# Patient Record
Sex: Female | Born: 1945 | Race: White | Hispanic: No | Smoking: Former smoker
Health system: Southern US, Community
[De-identification: ages and names within clinical notes are randomized; demographics above are authoritative.]

## PROBLEM LIST (undated history)

## (undated) DIAGNOSIS — D649 Anemia, unspecified: Secondary | ICD-10-CM

## (undated) DIAGNOSIS — I712 Thoracic aortic aneurysm, without rupture: Secondary | ICD-10-CM

## (undated) DIAGNOSIS — G4700 Insomnia, unspecified: Secondary | ICD-10-CM

## (undated) DIAGNOSIS — N63 Unspecified lump in unspecified breast: Secondary | ICD-10-CM

## (undated) DIAGNOSIS — E785 Hyperlipidemia, unspecified: Secondary | ICD-10-CM

## (undated) DIAGNOSIS — E7800 Pure hypercholesterolemia, unspecified: Secondary | ICD-10-CM

## (undated) DIAGNOSIS — M1990 Unspecified osteoarthritis, unspecified site: Secondary | ICD-10-CM

## (undated) DIAGNOSIS — K469 Unspecified abdominal hernia without obstruction or gangrene: Secondary | ICD-10-CM

## (undated) DIAGNOSIS — I10 Essential (primary) hypertension: Secondary | ICD-10-CM

## (undated) HISTORY — DX: Insomnia, unspecified: G47.00

## (undated) HISTORY — DX: Unspecified osteoarthritis, unspecified site: M19.90

## (undated) HISTORY — DX: Thoracic aortic aneurysm, without rupture: I71.2

## (undated) HISTORY — PX: OOPHORECTOMY: SHX86

## (undated) HISTORY — DX: Pure hypercholesterolemia, unspecified: E78.00

## (undated) HISTORY — DX: Anemia, unspecified: D64.9

## (undated) HISTORY — PX: BLADDER SURGERY: SHX569

## (undated) HISTORY — PX: COLON SURGERY: SHX602

## (undated) HISTORY — DX: Unspecified abdominal hernia without obstruction or gangrene: K46.9

## (undated) HISTORY — DX: Unspecified lump in unspecified breast: N63.0

## (undated) HISTORY — PX: VESICOVAGINAL FISTULA CLOSURE W/ TAH: SUR271

## (undated) HISTORY — DX: Hyperlipidemia, unspecified: E78.5

## (undated) HISTORY — PX: OTHER SURGICAL HISTORY: SHX169

## (undated) HISTORY — DX: Essential (primary) hypertension: I10

---

## 1988-07-14 HISTORY — PX: ABDOMINAL HYSTERECTOMY: SHX81

## 1988-07-14 HISTORY — PX: FOOT SURGERY: SHX648

## 1997-12-27 ENCOUNTER — Ambulatory Visit (HOSPITAL_COMMUNITY): Admission: RE | Admit: 1997-12-27 | Discharge: 1997-12-28 | Payer: Self-pay | Admitting: Urology

## 1998-06-29 ENCOUNTER — Other Ambulatory Visit: Admission: RE | Admit: 1998-06-29 | Discharge: 1998-06-29 | Payer: Self-pay | Admitting: *Deleted

## 1998-07-30 ENCOUNTER — Emergency Department (HOSPITAL_COMMUNITY): Admission: EM | Admit: 1998-07-30 | Discharge: 1998-07-30 | Payer: Self-pay | Admitting: Emergency Medicine

## 1998-08-06 ENCOUNTER — Encounter: Payer: Self-pay | Admitting: Family Medicine

## 1998-08-06 ENCOUNTER — Ambulatory Visit (HOSPITAL_COMMUNITY): Admission: RE | Admit: 1998-08-06 | Discharge: 1998-08-06 | Payer: Self-pay | Admitting: Family Medicine

## 1999-06-28 ENCOUNTER — Other Ambulatory Visit: Admission: RE | Admit: 1999-06-28 | Discharge: 1999-06-28 | Payer: Self-pay | Admitting: *Deleted

## 2000-07-22 ENCOUNTER — Other Ambulatory Visit: Admission: RE | Admit: 2000-07-22 | Discharge: 2000-07-22 | Payer: Self-pay | Admitting: *Deleted

## 2002-07-22 ENCOUNTER — Other Ambulatory Visit: Admission: RE | Admit: 2002-07-22 | Discharge: 2002-07-22 | Payer: Self-pay | Admitting: Internal Medicine

## 2003-01-12 HISTORY — PX: OTHER SURGICAL HISTORY: SHX169

## 2004-09-09 ENCOUNTER — Ambulatory Visit: Payer: Self-pay | Admitting: Internal Medicine

## 2004-09-16 ENCOUNTER — Ambulatory Visit: Payer: Self-pay | Admitting: Internal Medicine

## 2004-09-24 ENCOUNTER — Ambulatory Visit: Payer: Self-pay | Admitting: Family Medicine

## 2004-10-18 ENCOUNTER — Ambulatory Visit: Payer: Self-pay | Admitting: Internal Medicine

## 2005-02-11 ENCOUNTER — Inpatient Hospital Stay: Payer: Self-pay | Admitting: Specialist

## 2005-02-24 ENCOUNTER — Ambulatory Visit: Payer: Self-pay | Admitting: Internal Medicine

## 2005-07-14 DIAGNOSIS — I10 Essential (primary) hypertension: Secondary | ICD-10-CM

## 2005-07-14 HISTORY — DX: Essential (primary) hypertension: I10

## 2005-09-15 ENCOUNTER — Ambulatory Visit: Payer: Self-pay | Admitting: Internal Medicine

## 2005-09-22 ENCOUNTER — Ambulatory Visit: Payer: Self-pay | Admitting: Internal Medicine

## 2005-11-17 ENCOUNTER — Ambulatory Visit: Payer: Self-pay | Admitting: Internal Medicine

## 2006-04-06 ENCOUNTER — Ambulatory Visit: Payer: Self-pay | Admitting: Internal Medicine

## 2006-06-08 ENCOUNTER — Ambulatory Visit: Payer: Self-pay | Admitting: Internal Medicine

## 2006-09-12 ENCOUNTER — Ambulatory Visit: Payer: Self-pay | Admitting: Internal Medicine

## 2006-09-18 ENCOUNTER — Ambulatory Visit: Payer: Self-pay | Admitting: Internal Medicine

## 2006-09-18 LAB — CONVERTED CEMR LAB
ALT: 22 units/L (ref 0–40)
AST: 20 units/L (ref 0–37)
Albumin: 3.6 g/dL (ref 3.5–5.2)
Alkaline Phosphatase: 84 units/L (ref 39–117)
BUN: 11 mg/dL (ref 6–23)
Bacteria, UA: NEGATIVE
Basophils Absolute: 0 10*3/uL (ref 0.0–0.1)
Basophils Relative: 0.7 % (ref 0.0–1.0)
Bilirubin Urine: NEGATIVE
Bilirubin, Direct: 0.2 mg/dL (ref 0.0–0.3)
CO2: 28 meq/L (ref 19–32)
Calcium: 9.3 mg/dL (ref 8.4–10.5)
Chloride: 107 meq/L (ref 96–112)
Cholesterol: 121 mg/dL (ref 0–200)
Creatinine, Ser: 0.6 mg/dL (ref 0.4–1.2)
Crystals: NEGATIVE
Eosinophils Absolute: 0.2 10*3/uL (ref 0.0–0.6)
Eosinophils Relative: 3.3 % (ref 0.0–5.0)
GFR calc Af Amer: 130 mL/min
GFR calc non Af Amer: 107 mL/min
Glucose, Bld: 93 mg/dL (ref 70–99)
HCT: 41.3 % (ref 36.0–46.0)
HDL: 40.2 mg/dL (ref >39.0)
Hemoglobin, Urine: NEGATIVE
Hemoglobin: 14.1 g/dL (ref 12.0–15.0)
Ketones, ur: NEGATIVE mg/dL
LDL Cholesterol: 54 mg/dL (ref 0–99)
Lymphocytes Relative: 23.1 % (ref 12.0–46.0)
MCHC: 34.1 g/dL (ref 30.0–36.0)
MCV: 92.5 fL (ref 78.0–100.0)
Monocytes Absolute: 0.4 10*3/uL (ref 0.2–0.7)
Monocytes Relative: 7 % (ref 3.0–11.0)
Neutro Abs: 4.2 10*3/uL (ref 1.4–7.7)
Neutrophils Relative %: 65.9 % (ref 43.0–77.0)
Nitrite: NEGATIVE
Platelets: 295 10*3/uL (ref 150–400)
Potassium: 4.3 meq/L (ref 3.5–5.1)
RBC: 4.47 M/uL (ref 3.87–5.11)
RDW: 11.8 % (ref 11.5–14.6)
Sodium: 142 meq/L (ref 135–145)
Specific Gravity, Urine: 1.01 (ref 1.000–1.03)
TSH: 1.8 microintl units/mL (ref 0.35–5.50)
Total Bilirubin: 1 mg/dL (ref 0.3–1.2)
Total CHOL/HDL Ratio: 3
Total Protein, Urine: NEGATIVE mg/dL
Total Protein: 6.4 g/dL (ref 6.0–8.3)
Triglycerides: 132 mg/dL (ref 0–149)
Urine Glucose: NEGATIVE mg/dL
Urobilinogen, UA: 0.2 (ref 0.0–1.0)
VLDL: 26 mg/dL (ref 0–40)
WBC: 6.3 10*3/uL (ref 4.5–10.5)
pH: 6.5 (ref 5.0–8.0)

## 2006-09-24 ENCOUNTER — Ambulatory Visit: Payer: Self-pay | Admitting: Internal Medicine

## 2006-11-16 ENCOUNTER — Ambulatory Visit: Payer: Self-pay | Admitting: Internal Medicine

## 2006-12-12 ENCOUNTER — Ambulatory Visit: Payer: Self-pay | Admitting: Family Medicine

## 2007-01-13 ENCOUNTER — Ambulatory Visit: Payer: Self-pay | Admitting: Internal Medicine

## 2007-01-14 ENCOUNTER — Encounter: Payer: Self-pay | Admitting: Internal Medicine

## 2007-02-05 DIAGNOSIS — E785 Hyperlipidemia, unspecified: Secondary | ICD-10-CM

## 2007-02-25 ENCOUNTER — Encounter: Payer: Self-pay | Admitting: Internal Medicine

## 2007-03-10 DIAGNOSIS — M1990 Unspecified osteoarthritis, unspecified site: Secondary | ICD-10-CM | POA: Insufficient documentation

## 2007-03-10 DIAGNOSIS — G4700 Insomnia, unspecified: Secondary | ICD-10-CM

## 2007-03-10 DIAGNOSIS — J309 Allergic rhinitis, unspecified: Secondary | ICD-10-CM | POA: Insufficient documentation

## 2007-03-10 DIAGNOSIS — I10 Essential (primary) hypertension: Secondary | ICD-10-CM | POA: Insufficient documentation

## 2007-06-11 ENCOUNTER — Ambulatory Visit: Payer: Self-pay | Admitting: Internal Medicine

## 2007-06-11 DIAGNOSIS — J019 Acute sinusitis, unspecified: Secondary | ICD-10-CM

## 2007-06-11 DIAGNOSIS — N390 Urinary tract infection, site not specified: Secondary | ICD-10-CM

## 2007-06-11 DIAGNOSIS — D649 Anemia, unspecified: Secondary | ICD-10-CM

## 2007-06-11 LAB — CONVERTED CEMR LAB
Bilirubin Urine: NEGATIVE
Blood in Urine, dipstick: NEGATIVE
Glucose, Urine, Semiquant: NEGATIVE
Ketones, urine, test strip: NEGATIVE
Nitrite: POSITIVE
Protein, U semiquant: NEGATIVE
Specific Gravity, Urine: 1.015
Urobilinogen, UA: NEGATIVE
pH: 6.5

## 2007-09-20 ENCOUNTER — Ambulatory Visit: Payer: Self-pay | Admitting: Internal Medicine

## 2007-09-27 ENCOUNTER — Ambulatory Visit: Payer: Self-pay | Admitting: Internal Medicine

## 2007-09-27 DIAGNOSIS — I8390 Asymptomatic varicose veins of unspecified lower extremity: Secondary | ICD-10-CM

## 2007-10-06 ENCOUNTER — Encounter: Payer: Self-pay | Admitting: Internal Medicine

## 2007-10-06 ENCOUNTER — Ambulatory Visit: Payer: Self-pay | Admitting: Vascular Surgery

## 2007-10-25 ENCOUNTER — Ambulatory Visit: Payer: Self-pay | Admitting: Internal Medicine

## 2007-10-25 LAB — CONVERTED CEMR LAB
ALT: 19 units/L (ref 0–35)
AST: 20 units/L (ref 0–37)
Albumin: 3.6 g/dL (ref 3.5–5.2)
Alkaline Phosphatase: 74 units/L (ref 39–117)
Bilirubin, Direct: 0.1 mg/dL (ref 0.0–0.3)
Cholesterol: 124 mg/dL (ref 0–200)
HDL: 33.9 mg/dL — ABNORMAL LOW (ref >39.0)
LDL Cholesterol: 53 mg/dL (ref 0–99)
Total Bilirubin: 0.8 mg/dL (ref 0.3–1.2)
Total CHOL/HDL Ratio: 3.7
Total Protein: 6.5 g/dL (ref 6.0–8.3)
Triglycerides: 185 mg/dL — ABNORMAL HIGH (ref 0–149)
VLDL: 37 mg/dL (ref 0–40)

## 2008-01-21 ENCOUNTER — Ambulatory Visit: Payer: Self-pay | Admitting: Vascular Surgery

## 2008-02-05 ENCOUNTER — Ambulatory Visit: Payer: Self-pay | Admitting: Family Medicine

## 2008-02-05 LAB — CONVERTED CEMR LAB
Blood in Urine, dipstick: NEGATIVE
Glucose, Urine, Semiquant: NEGATIVE
Ketones, urine, test strip: NEGATIVE
Nitrite: NEGATIVE
Protein, U semiquant: NEGATIVE
Specific Gravity, Urine: 1.015
Urobilinogen, UA: 0.2
WBC Urine, dipstick: NEGATIVE
pH: 6.5

## 2008-02-23 ENCOUNTER — Ambulatory Visit: Payer: Self-pay | Admitting: Vascular Surgery

## 2008-03-01 ENCOUNTER — Ambulatory Visit: Payer: Self-pay | Admitting: Vascular Surgery

## 2008-03-22 ENCOUNTER — Ambulatory Visit: Payer: Self-pay | Admitting: Vascular Surgery

## 2008-03-29 ENCOUNTER — Ambulatory Visit: Payer: Self-pay | Admitting: Vascular Surgery

## 2008-04-12 ENCOUNTER — Ambulatory Visit: Payer: Self-pay | Admitting: Internal Medicine

## 2008-04-12 LAB — CONVERTED CEMR LAB
Basophils Absolute: 0 10*3/uL (ref 0.0–0.1)
Basophils Relative: 0.4 % (ref 0.0–3.0)
Bilirubin Urine: NEGATIVE
Blood in Urine, dipstick: NEGATIVE
Eosinophils Absolute: 0.1 10*3/uL (ref 0.0–0.7)
Eosinophils Relative: 1.6 % (ref 0.0–5.0)
Folate: 18.5 ng/mL
Glucose, Urine, Semiquant: NEGATIVE
HCT: 39.5 % (ref 36.0–46.0)
Hemoglobin: 13.3 g/dL (ref 12.0–15.0)
Iron: 48 ug/dL (ref 42–145)
Lymphocytes Relative: 21.8 % (ref 12.0–46.0)
MCHC: 33.8 g/dL (ref 30.0–36.0)
MCV: 91.6 fL (ref 78.0–100.0)
Monocytes Absolute: 0.7 10*3/uL (ref 0.1–1.0)
Monocytes Relative: 8.1 % (ref 3.0–12.0)
Neutro Abs: 5.8 10*3/uL (ref 1.4–7.7)
Neutrophils Relative %: 68.1 % (ref 43.0–77.0)
Nitrite: POSITIVE
Platelets: 351 10*3/uL (ref 150–400)
RBC: 4.31 M/uL (ref 3.87–5.11)
RDW: 12.9 % (ref 11.5–14.6)
Saturation Ratios: 13.7 % — ABNORMAL LOW (ref 20.0–50.0)
Specific Gravity, Urine: 1.02
Transferrin: 250.1 mg/dL (ref >=212.0)
Urobilinogen, UA: 0.2
Vitamin B-12: 386 pg/mL (ref 211–911)
WBC: 8.4 10*3/uL (ref 4.5–10.5)
pH: 5

## 2008-04-13 ENCOUNTER — Encounter: Payer: Self-pay | Admitting: Internal Medicine

## 2008-04-26 ENCOUNTER — Ambulatory Visit: Payer: Self-pay | Admitting: Vascular Surgery

## 2008-04-29 ENCOUNTER — Ambulatory Visit: Payer: Self-pay | Admitting: Internal Medicine

## 2008-07-14 DIAGNOSIS — K469 Unspecified abdominal hernia without obstruction or gangrene: Secondary | ICD-10-CM

## 2008-07-14 HISTORY — DX: Unspecified abdominal hernia without obstruction or gangrene: K46.9

## 2008-07-14 HISTORY — PX: OTHER SURGICAL HISTORY: SHX169

## 2008-08-09 ENCOUNTER — Ambulatory Visit: Payer: Self-pay | Admitting: Internal Medicine

## 2008-08-22 ENCOUNTER — Ambulatory Visit: Payer: Self-pay | Admitting: Internal Medicine

## 2008-08-22 DIAGNOSIS — N318 Other neuromuscular dysfunction of bladder: Secondary | ICD-10-CM

## 2008-08-22 DIAGNOSIS — H6690 Otitis media, unspecified, unspecified ear: Secondary | ICD-10-CM | POA: Insufficient documentation

## 2008-08-22 DIAGNOSIS — R300 Dysuria: Secondary | ICD-10-CM | POA: Insufficient documentation

## 2008-08-22 LAB — CONVERTED CEMR LAB
Bilirubin Urine: NEGATIVE
Crystals: NEGATIVE
Hemoglobin, Urine: NEGATIVE
Ketones, ur: NEGATIVE mg/dL
Mucus, UA: NEGATIVE
Nitrite: NEGATIVE
Specific Gravity, Urine: <=1.005 (ref 1.000–1.03)
Total Protein, Urine: NEGATIVE mg/dL
Urine Glucose: NEGATIVE mg/dL
Urobilinogen, UA: 0.2 (ref 0.0–1.0)
pH: 6 (ref 5.0–8.0)

## 2008-08-23 ENCOUNTER — Encounter: Payer: Self-pay | Admitting: Internal Medicine

## 2008-09-11 ENCOUNTER — Ambulatory Visit: Payer: Self-pay | Admitting: Internal Medicine

## 2008-09-11 DIAGNOSIS — M25569 Pain in unspecified knee: Secondary | ICD-10-CM

## 2008-09-22 ENCOUNTER — Encounter: Payer: Self-pay | Admitting: Internal Medicine

## 2008-09-28 ENCOUNTER — Ambulatory Visit: Payer: Self-pay | Admitting: Internal Medicine

## 2008-12-06 ENCOUNTER — Encounter: Payer: Self-pay | Admitting: Internal Medicine

## 2008-12-06 ENCOUNTER — Ambulatory Visit: Payer: Self-pay | Admitting: Internal Medicine

## 2009-01-26 ENCOUNTER — Inpatient Hospital Stay: Payer: Self-pay | Admitting: Vascular Surgery

## 2009-02-05 ENCOUNTER — Inpatient Hospital Stay: Payer: Self-pay | Admitting: Vascular Surgery

## 2009-02-21 ENCOUNTER — Inpatient Hospital Stay: Payer: Self-pay | Admitting: Vascular Surgery

## 2009-02-26 ENCOUNTER — Ambulatory Visit: Payer: Self-pay | Admitting: Vascular Surgery

## 2009-03-01 ENCOUNTER — Ambulatory Visit: Payer: Self-pay | Admitting: Vascular Surgery

## 2009-03-05 ENCOUNTER — Ambulatory Visit: Payer: Self-pay | Admitting: Vascular Surgery

## 2009-03-07 ENCOUNTER — Ambulatory Visit: Payer: Self-pay | Admitting: Vascular Surgery

## 2009-03-12 ENCOUNTER — Ambulatory Visit: Payer: Self-pay | Admitting: Vascular Surgery

## 2009-03-16 ENCOUNTER — Ambulatory Visit: Payer: Self-pay | Admitting: Vascular Surgery

## 2009-03-20 ENCOUNTER — Inpatient Hospital Stay: Payer: Self-pay | Admitting: Vascular Surgery

## 2009-03-26 ENCOUNTER — Ambulatory Visit: Payer: Self-pay | Admitting: Vascular Surgery

## 2009-03-28 ENCOUNTER — Ambulatory Visit: Payer: Self-pay | Admitting: Vascular Surgery

## 2009-05-10 ENCOUNTER — Ambulatory Visit: Payer: Self-pay | Admitting: Vascular Surgery

## 2009-05-11 ENCOUNTER — Ambulatory Visit: Payer: Self-pay | Admitting: Vascular Surgery

## 2009-07-14 DIAGNOSIS — I712 Thoracic aortic aneurysm, without rupture, unspecified: Secondary | ICD-10-CM

## 2009-07-14 HISTORY — DX: Thoracic aortic aneurysm, without rupture: I71.2

## 2009-07-14 HISTORY — DX: Thoracic aortic aneurysm, without rupture, unspecified: I71.20

## 2009-09-27 ENCOUNTER — Ambulatory Visit: Payer: Self-pay | Admitting: Internal Medicine

## 2010-03-19 ENCOUNTER — Ambulatory Visit: Payer: Self-pay | Admitting: Otolaryngology

## 2010-03-25 ENCOUNTER — Ambulatory Visit: Payer: Self-pay | Admitting: Internal Medicine

## 2010-05-14 ENCOUNTER — Encounter: Payer: Self-pay | Admitting: Cardiovascular Disease

## 2010-06-13 ENCOUNTER — Encounter: Payer: Self-pay | Admitting: Cardiovascular Disease

## 2010-06-20 ENCOUNTER — Encounter: Payer: Self-pay | Admitting: Cardiovascular Disease

## 2010-07-14 DIAGNOSIS — M1990 Unspecified osteoarthritis, unspecified site: Secondary | ICD-10-CM

## 2010-07-14 HISTORY — DX: Unspecified osteoarthritis, unspecified site: M19.90

## 2010-07-14 HISTORY — PX: CHOLECYSTECTOMY: SHX55

## 2010-07-16 ENCOUNTER — Encounter: Payer: Self-pay | Admitting: Cardiovascular Disease

## 2010-07-16 ENCOUNTER — Ambulatory Visit
Admission: RE | Admit: 2010-07-16 | Discharge: 2010-07-16 | Payer: Self-pay | Source: Home / Self Care | Attending: Cardiovascular Disease | Admitting: Cardiovascular Disease

## 2010-07-16 DIAGNOSIS — F17200 Nicotine dependence, unspecified, uncomplicated: Secondary | ICD-10-CM | POA: Insufficient documentation

## 2010-07-16 DIAGNOSIS — R011 Cardiac murmur, unspecified: Secondary | ICD-10-CM | POA: Insufficient documentation

## 2010-07-16 DIAGNOSIS — R0602 Shortness of breath: Secondary | ICD-10-CM | POA: Insufficient documentation

## 2010-07-30 ENCOUNTER — Ambulatory Visit: Admission: RE | Admit: 2010-07-30 | Discharge: 2010-07-30 | Payer: Self-pay | Source: Home / Self Care

## 2010-07-30 ENCOUNTER — Encounter: Payer: Self-pay | Admitting: Cardiovascular Disease

## 2010-08-01 ENCOUNTER — Encounter: Payer: Self-pay | Admitting: Cardiovascular Disease

## 2010-08-01 ENCOUNTER — Ambulatory Visit: Admit: 2010-08-01 | Payer: Self-pay | Admitting: Cardiovascular Disease

## 2010-08-01 DIAGNOSIS — I712 Thoracic aortic aneurysm, without rupture, unspecified: Secondary | ICD-10-CM | POA: Insufficient documentation

## 2010-08-02 ENCOUNTER — Encounter: Payer: Self-pay | Admitting: Cardiovascular Disease

## 2010-08-02 ENCOUNTER — Ambulatory Visit
Admission: RE | Admit: 2010-08-02 | Discharge: 2010-08-02 | Payer: Self-pay | Source: Home / Self Care | Attending: Cardiovascular Disease | Admitting: Cardiovascular Disease

## 2010-08-05 LAB — CONVERTED CEMR LAB
BUN: 15 mg/dL (ref 6–23)
CO2: 21 meq/L (ref 19–32)
Glucose, Bld: 87 mg/dL (ref 70–99)
Potassium: 4.5 meq/L (ref 3.5–5.3)
Sodium: 138 meq/L (ref 135–145)

## 2010-08-06 ENCOUNTER — Encounter: Payer: Self-pay | Admitting: Cardiovascular Disease

## 2010-08-06 ENCOUNTER — Ambulatory Visit (HOSPITAL_COMMUNITY)
Admission: RE | Admit: 2010-08-06 | Discharge: 2010-08-06 | Payer: Self-pay | Source: Home / Self Care | Attending: Cardiovascular Disease | Admitting: Cardiovascular Disease

## 2010-08-11 LAB — CONVERTED CEMR LAB
ALT: 25 units/L (ref 0–35)
ALT: 33 units/L (ref 0–35)
AST: 21 units/L (ref 0–37)
AST: 24 units/L (ref 0–37)
Albumin: 3.7 g/dL (ref 3.5–5.2)
Albumin: 3.9 g/dL (ref 3.5–5.2)
Alkaline Phosphatase: 76 units/L (ref 39–117)
Alkaline Phosphatase: 91 units/L (ref 39–117)
BUN: 10 mg/dL (ref 6–23)
BUN: 13 mg/dL (ref 6–23)
Bacteria, UA: NEGATIVE
Basophils Absolute: 0.1 10*3/uL (ref 0.0–0.1)
Basophils Absolute: 0.1 10*3/uL (ref 0.0–0.1)
Basophils Relative: 0.7 % (ref 0.0–3.0)
Basophils Relative: 0.8 % (ref 0.0–1.0)
Bilirubin Urine: NEGATIVE
Bilirubin Urine: NEGATIVE
Bilirubin, Direct: 0.2 mg/dL (ref 0.0–0.3)
Bilirubin, Direct: 0.2 mg/dL (ref 0.0–0.3)
CO2: 26 meq/L (ref 19–32)
CO2: 27 meq/L (ref 19–32)
Calcium: 9.4 mg/dL (ref 8.4–10.5)
Calcium: 9.7 mg/dL (ref 8.4–10.5)
Chloride: 107 meq/L (ref 96–112)
Chloride: 110 meq/L (ref 96–112)
Cholesterol: 131 mg/dL (ref 0–200)
Cholesterol: 134 mg/dL (ref 0–200)
Creatinine, Ser: 0.6 mg/dL (ref 0.4–1.2)
Creatinine, Ser: 0.6 mg/dL (ref 0.4–1.2)
Crystals: NEGATIVE
Direct LDL: 56.8 mg/dL
Eosinophils Absolute: 0.1 10*3/uL (ref 0.0–0.7)
Eosinophils Absolute: 0.2 10*3/uL (ref 0.0–0.6)
Eosinophils Relative: 1.6 % (ref 0.0–5.0)
Eosinophils Relative: 2.1 % (ref 0.0–5.0)
GFR calc Af Amer: 129 mL/min
GFR calc non Af Amer: 106.58 mL/min (ref >60)
GFR calc non Af Amer: 107 mL/min
Glucose, Bld: 90 mg/dL (ref 70–99)
Glucose, Bld: 91 mg/dL (ref 70–99)
HCT: 40.1 % (ref 36.0–46.0)
HCT: 44.5 % (ref 36.0–46.0)
HDL: 32.9 mg/dL — ABNORMAL LOW (ref >39.0)
HDL: 35.2 mg/dL — ABNORMAL LOW (ref >39.00)
Hemoglobin, Urine: NEGATIVE
Hemoglobin, Urine: NEGATIVE
Hemoglobin: 13.2 g/dL (ref 12.0–15.0)
Hemoglobin: 15.1 g/dL — ABNORMAL HIGH (ref 12.0–15.0)
Ketones, ur: NEGATIVE mg/dL
Ketones, ur: NEGATIVE mg/dL
LDL Cholesterol: 59 mg/dL (ref 0–99)
Lymphocytes Relative: 19.9 % (ref 12.0–46.0)
Lymphocytes Relative: 22.5 % (ref 12.0–46.0)
Lymphs Abs: 1.7 10*3/uL (ref 0.7–4.0)
MCHC: 33 g/dL (ref 30.0–36.0)
MCHC: 33.9 g/dL (ref 30.0–36.0)
MCV: 90.2 fL (ref 78.0–100.0)
MCV: 91.6 fL (ref 78.0–100.0)
Monocytes Absolute: 0.5 10*3/uL (ref 0.1–1.0)
Monocytes Absolute: 0.7 10*3/uL (ref 0.2–0.7)
Monocytes Relative: 7.1 % (ref 3.0–12.0)
Monocytes Relative: 8.3 % (ref 3.0–11.0)
Mucus, UA: NEGATIVE
Neutro Abs: 5.2 10*3/uL (ref 1.4–7.7)
Neutro Abs: 5.4 10*3/uL (ref 1.4–7.7)
Neutrophils Relative %: 68.1 % (ref 43.0–77.0)
Neutrophils Relative %: 68.9 % (ref 43.0–77.0)
Nitrite: NEGATIVE
Nitrite: NEGATIVE
Platelets: 288 10*3/uL (ref 150.0–400.0)
Platelets: 318 10*3/uL (ref 150–400)
Potassium: 4.3 meq/L (ref 3.5–5.1)
Potassium: 4.7 meq/L (ref 3.5–5.1)
RBC: 4.44 M/uL (ref 3.87–5.11)
RBC: 4.85 M/uL (ref 3.87–5.11)
RDW: 12.9 % (ref 11.5–14.6)
RDW: 13.8 % (ref 11.5–14.6)
Sodium: 139 meq/L (ref 135–145)
Sodium: 143 meq/L (ref 135–145)
Specific Gravity, Urine: 1.01 (ref 1.000–1.03)
Specific Gravity, Urine: <=1.005 (ref 1.000–1.030)
TSH: 1.95 microintl units/mL (ref 0.35–5.50)
TSH: 2.65 microintl units/mL (ref 0.35–5.50)
Total Bilirubin: 0.7 mg/dL (ref 0.3–1.2)
Total Bilirubin: 1 mg/dL (ref 0.3–1.2)
Total CHOL/HDL Ratio: 4
Total CHOL/HDL Ratio: 4
Total Protein, Urine: NEGATIVE mg/dL
Total Protein, Urine: NEGATIVE mg/dL
Total Protein: 6.1 g/dL (ref 6.0–8.3)
Total Protein: 6.4 g/dL (ref 6.0–8.3)
Triglycerides: 198 mg/dL — ABNORMAL HIGH (ref 0–149)
Triglycerides: 300 mg/dL — ABNORMAL HIGH (ref 0.0–149.0)
Urine Glucose: NEGATIVE mg/dL
Urine Glucose: NEGATIVE mg/dL
Urobilinogen, UA: 0.2 (ref 0.0–1.0)
Urobilinogen, UA: 0.2 (ref 0.0–1.0)
VLDL: 40 mg/dL (ref 0–40)
VLDL: 60 mg/dL — ABNORMAL HIGH (ref 0.0–40.0)
Vit D, 25-Hydroxy: 33 ng/mL (ref 30–89)
WBC: 7.6 10*3/uL (ref 4.5–10.5)
WBC: 8 10*3/uL (ref 4.5–10.5)
pH: 5.5 (ref 5.0–8.0)
pH: 6 (ref 5.0–8.0)

## 2010-08-15 NOTE — Miscellaneous (Signed)
Summary: CTA Chest orders  Clinical Lists Changes  Orders: Added new Test order of T-CT Chest Angiography ([id number]) - Signed

## 2010-08-15 NOTE — Miscellaneous (Signed)
Summary: CTA Chest orders  Clinical Lists Changes  Problems: Added new problem of ASCENDING AORTIC ANEURYSM (ICD-441.2) Orders: Added new Referral order of Cardiac CTA (Cardiac CTA) - Signed

## 2010-08-15 NOTE — Letter (Signed)
Summary: [facility]   Imported By: Marylou Mccoy 07/25/2010 10:25:40  _____________________________________________________________________  External Attachment:    Type:   Image     Comment:   External Document

## 2010-08-15 NOTE — Assessment & Plan Note (Signed)
Summary: NP6/AMD   Visit Type:  Initial Consult Primary Provider:  Carmine Savoy.  CC:  c/o chest congestion with shortness of breath; she does have a sinus infection.Marland Kitchen  History of Present Illness: [name] is a very pleasant 65 year old woman with a history of smoking who presents for evaluation of shortness of breath or chest the patient of Dr. Dan Humphreys.  She reports that since June, she has had waxing and waning shortness of breath. She attributes this to bouts of upper respiratory infection. She notices the symptoms more when she is walking or exerting herself. She has had recent problems over the past month for sinusitis though the shortness of breath occurred prior to that. She denies any significant lower extremity edema, cough, lightheadedness. She reports never having had heart problems before. She denies any significant palpitations.  Notes from Dr. Dan Humphreys indicate that her cholesterol is well controlled with LDL of 31 on a statin. She is on a medication for hypertension.  EKG today shows sinus tachycardia with a rate of 108 beats per minute, no other significant ST or T wave changes  Labs provided show normal basic metabolic panel, normal LFTs, low vitamin D at 27  Current Medications (verified): 1)  Simvastatin 10 Mg Tabs (Simvastatin) .... One Tablet At Bedtime 2)  Ecotrin Low Strength 81 Mg  Tbec (Aspirin) .Marland Kitchen.. 1 By Mouth Qd 3)  Meloxicam 15 Mg Tabs (Meloxicam) .... One Tablet Every Other Day 4)  Cetirizine Hcl 10 Mg Tabs (Cetirizine Hcl) .Marland Kitchen.. 1 By Mouth Once Daily 5)  Lisinopril-Hydrochlorothiazide 20-25 Mg Tabs (Lisinopril-Hydrochlorothiazide) .... One Tablet Once Daily 6)  Nitrofurantoin Macrocrystal 50 Mg Caps (Nitrofurantoin Macrocrystal) .... One Tablet Once Daily 7)  Niacin Cr 1000 Mg Cr-Tabs (Niacin) .... One Tablet Once Daily 8)  Cranberry Tablet 850mg  .... One Tablet Once Daily 9)  Fish Oil Maximum Strength 1200 Mg Caps (Omega-3 Fatty Acids) .... One Tablet  Once Daily 10)  Glucosamine-Chondroitin  Tabs (Glucosamine-Chondroit-Vit C-Mn) .... One Tablet Once Daily 11)  Vitamin E 400 Unit Caps (Vitamin E) .... One Tablet Once Daily 12)  Vitamin D3 1000 Unit Tabs (Cholecalciferol) .... Once A Month 13)  Centrum Silver  Tabs (Multiple Vitamins-Minerals) .... One Tablet Once Daily 14)  Vitamin C 500 Mg .... One Tablet Once Daily  Allergies (verified): 1)  ! * Dogs  Past History:  Past Medical History: Last updated: 06/11/2007 Hyperlipidemia Allergic rhinitis Hypertension Osteoarthritis- l wrist Insomnia Anemia-NOS  Past Surgical History: Last updated: 09/28/2008 Hysterectomy Bilateral Leg Stripping Oophorectomy, right Bladder tack x 2 - last 10/04 s/p open laparotomy for benign colon mass 7/04 - benign s/p facial and neck skin cancer - basal  cell  Family History: Last updated: 09/27/2007 mother with breast cancer and dementia and stroke sister with liver cancer brother and father with CAD/MI Father with stroke as well 1 son with cerebral palsy 1 son with suicide  Social History: Last updated: 08/09/2008 Current Smoker Alcohol use-no 2 children 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  The patient denies fever, weight loss, weight gain, vision loss, decreased hearing, hoarseness, chest pain, syncope, peripheral edema, prolonged cough, abdominal pain, incontinence, muscle weakness, depression, and enlarged lymph nodes.    Vital Signs:  Patient profile:   65 year old female Height:      67 inches Weight:  215.75 pounds BMI:     33.91 Pulse rate:   108 / minute BP sitting:   160 / 74  Vitals Entered By: Bishop Dublin, CMA (July 16, 2010 2:52 PM)  Physical Exam  General:  Well developed, well nourished, in no acute distress. Head:  normocephalic  and atraumatic Neck:  Neck supple, no JVD. No masses, thyromegaly or abnormal cervical nodes. Lungs:  Clear bilaterally to auscultation and percussion. Heart:  Non-displaced PMI, chest non-tender; regular/tachycardia, S1, S2 with II/VI SEM RSB, no  rubs or gallops. Carotid upstroke normal, no bruit. Normal abdominal aortic size, no bruits. Femorals normal pulses, no bruits. Pedals normal pulses. No edema, no varicosities. Abdomen:   mild obesity Msk:  Back normal, normal gait. Muscle strength and tone normal. Pulses:  pulses normal in all 4 extremities Extremities:  No clubbing or cyanosis. Neurologic:  Alert and oriented x 3. Skin:  Intact without lesions or rashes. Psych:  Normal affect.   Impression & Recommendations:  Problem # 1:  SHORTNESS OF BREATH (ICD-786.05) etiology of her shortness of breath is uncertain. I am concerned by her murmur on clinical exam. Muscle concerned by her tachycardia. The elevated heart rate was seen again when she was evaluated by Dr. Dan Humphreys in the past and again today. We have ordered an echocardiogram to evaluate her cardiac function, murmur, underlying tachycardia, shortness of breath. We will start her on metoprolol tartrate 25 mg b.i.d. in an effort to slow her rate to see if this helps her breathing as well.  We'll see her back in followup after her echocardiogram has been done with further discussion and medication titration if needed.  We will try to obtain her most recent TSH from Dr. Dan Humphreys. she does have risk factors for coronary artery disease given her underlying smoking, age, history of hyperlipidemia. If symptoms persist, we could order a perfusion study.   The following medications were removed from the medication list:    Hydrochlorothiazide 25 Mg Tabs (Hydrochlorothiazide) .Marland Kitchen... Take 1 tab by mouth every morning Her updated medication list for this problem includes:    Ecotrin Low Strength 81 Mg Tbec (Aspirin) .Marland Kitchen... 1 by mouth qd     Lisinopril-hydrochlorothiazide 20-25 Mg Tabs (Lisinopril-hydrochlorothiazide) ..... One tablet once daily    Metoprolol Tartrate 25 Mg Tabs (Metoprolol tartrate) ..... One tablet two times a day  Orders: Echocardiogram (Echo)  Problem # 2:  PREVENTIVE HEALTH CARE (ICD-V70.0) we'll try to obtain the most recent lipid panel for our records  Problem # 3:  SMOKER (ICD-305.1) we did talk about smoking cessation with her. I strongly encouraged her to quit smoking. In followup, we will bring this up again and offer various treatment options including Chantix.  Patient Instructions: 1)  Your physician has requested that you have an echocardiogram.  Echocardiography is a painless test that uses sound waves to create images of your heart. It provides your doctor with information about the size and shape of your heart and how well your heart's chambers and valves are working.  This procedure takes approximately one hour. There are no restrictions for this procedure. 2)  Your physician recommends that you schedule a follow-up appointment in: After Echo. 3)  Your physician has recommended you make the following change in your medication: Start taking Metoprolol Tartrate 25 mg one tablet two times a day. Prescriptions: METOPROLOL TARTRATE 25 MG TABS (METOPROLOL TARTRATE) one tablet two times a day  #60 x 3   Entered by:   Bishop Dublin,  CMA   Authorized by:   Dossie Arbour MD   Signed by:   Bishop Dublin, CMA on 07/16/2010   Method used:   Electronically to        [facility] [phone].* (retail)       [address]       [place], [place]  [address]       Ph: [phone]       Fax: [phone]   RxID:   [id number] METOPROLOL TARTRATE 25 MG TABS (METOPROLOL TARTRATE) one tablet two times a day  #60 x 3   Entered by:   Bishop Dublin, CMA   Authorized by:   Dossie Arbour MD   Signed by:   Bishop Dublin, CMA on 07/16/2010   Method used:   Electronically to        [facility] [phone]* (retail)       [address] [place], [place]  [address]       Ph: [phone] or [phone]       Fax: [phone]   RxID:   [id number]

## 2010-08-21 NOTE — Letter (Signed)
Summary: Labs from 09/05/09 - 06/13/10  Labs from 09/05/09 - 06/13/10   Imported By: Marylou Mccoy 08/13/2010 11:40:08  _____________________________________________________________________  External Attachment:    Type:   Image     Comment:   External Document

## 2010-09-10 NOTE — Letter (Signed)
Summary: [facility]: Notice of Privacy Practices  [facility]: Notice of Privacy Practices   Imported By: Earl Many 09/05/2010 18:24:57  _____________________________________________________________________  External Attachment:    Type:   Image     Comment:   External Document

## 2010-09-30 ENCOUNTER — Ambulatory Visit: Payer: Self-pay | Admitting: Internal Medicine

## 2010-10-08 ENCOUNTER — Ambulatory Visit: Payer: Self-pay | Admitting: Internal Medicine

## 2010-11-19 LAB — HEPATIC FUNCTION PANEL
ALT: 35 U/L (ref 7–35)
Alkaline Phosphatase: 78 U/L (ref 25–125)

## 2010-11-19 LAB — BASIC METABOLIC PANEL
BUN: 18 mg/dL (ref 4–21)
Creatinine: 0.7 mg/dL (ref 0.5–1.1)
Potassium: 4.8 mmol/L (ref 3.4–5.3)
Sodium: 139 mmol/L (ref 137–147)

## 2010-11-21 ENCOUNTER — Ambulatory Visit: Payer: Self-pay | Admitting: Internal Medicine

## 2010-11-26 NOTE — Assessment & Plan Note (Signed)
OFFICE VISIT   Bailey, Howard  DOB:  1945-08-28                                       04/26/2008  [id number]#:[id number]   Patient presents today for evaluation of right hip and lateral thigh  discomfort.  She is status post bilateral staged laser ablation of  saphenous vein and stab phlebectomies.  She reports that she had  resolution of her discomfort, and over the past week has been having new  pain, most particularly in her lateral thigh, and the mechanics extend  down into her calf.  She has not noticed any new swelling.  She is  taking ibuprofen, but this is not relieving her discomfort.  She has had  pulled muscle in the past.   PHYSICAL EXAMINATION:  Her leg is without any evidence of superficial  thrombophlebitis or earlier new problems.  She does not have any  particular swelling but does have palpable pedal pulses.   She underwent duplex of her common femoral vein on the right, and this  reveals no evidence of thrombus.   I reassured the patient of this.  She is requesting something stronger  than Advil, and I have written her for Tylox 1-2 q.4h. p.r.n. with #30,  no refills.  I explained that if she does have persistent pain, she  should touch base with her medical doctor for further evaluation.  I do  not feel it is related to venous pathology.  She will see [place] on an as-  needed basis.   Larina Earthly, M.D.  Electronically Signed   TFE/MEDQ  D:  04/26/2008  T:  04/27/2008  Job:  [id number]

## 2010-11-26 NOTE — Assessment & Plan Note (Signed)
OFFICE VISIT   Bailey Howard, Bailey Howard  DOB:  1945/10/23                                       03/29/2008  [id number]#:[id number]   Patient presents today one week status post laser ablation and stab  phlebectomy of greater saphenous vein and tributaries on her left leg.  She has had a prior left leg, similar treatment.  She has done quite  well with the procedure.  She does have the usual amount of erythema and  discomfort.  This is resolving.  Her stab sites all look quite good.  She has the usual amount of bruising.   She underwent duplex today in our office, and this reveals successful  ablation of her saphenous vein with closure of this just below the  saphenofemoral junction.  Her common femoral vein and deep system is  without injury.  I am quite pleased with her initial result.  She will  continue her usual activity.  She will wear a compression garment for  one additional week, and we will see her again in six weeks for final  followup.   Larina Earthly, M.D.  Electronically Signed   TFE/MEDQ  D:  03/29/2008  T:  03/30/2008  Job:  [id number]   cc:   Corwin Levins, MD

## 2010-11-26 NOTE — Assessment & Plan Note (Signed)
OFFICE VISIT   Bailey Howard, Bailey Howard  DOB:  1946-04-24                                       01/21/2008  [id number]#:[id number]   Patient presents today for continued followup of her severe venous  hypertension and varicose veins bilaterally.  She has been compliant  with her graduated compression garments since my last visit with her.  Unfortunately, she continues to have significant pain associated with  her venous varicosities.  She has some marked tributary varicosities in  both legs, most pronounced in her calves.  She states that prolonged  standing is difficult due to leg pain and swelling.  She has a job at [employer] which requires her to stand for eight hour shifts, and  this is difficult due to her leg pain.  She also has difficulty with  bending and squatting secondary to pain, making housework difficult.  Also has difficulty falling asleep secondary to leg pain and discomfort.   PHYSICAL EXAMINATION:  Unchanged with marked tributary varicosities  bilaterally.  She does have 2+ dorsalis pedis pulses bilaterally.   She underwent a formal venous duplex today, and this confirms reflux in  her greater saphenous vein from her superficial femoral vein distally.  Fortunately, she does not have any evidence of deep venous thrombosis or  significant deep venous reflux.  I discussed the options with patient.  She reports that she is having more pain in her right leg than left;  therefore we would recommend right leg saphenous vein laser ablation and  stab phlebectomy for relief of her venous hypertension.  We would then  follow up with left leg similar treatment when she has recovered from  her right leg.  We will schedule this at her convenience.   Larina Earthly, M.D.  Electronically Signed   TFE/MEDQ  D:  01/21/2008  T:  01/21/2008  Job:  [id number]   cc:   Corwin Levins, MD

## 2010-11-26 NOTE — Procedures (Signed)
DUPLEX DEEP VENOUS EXAM - LOWER EXTREMITY   INDICATION:  Right lower extremity pain.   HISTORY:  Edema:  No.  Trauma/Surgery:  Yes.  Pain:  Yes.  PE:  No.  Previous DVT:  No.  Anticoagulants:  Other:   DUPLEX EXAM:                CFV   SFV   PopV  PTV    GSV                R  L  R  L  R  L  R   L  R  L  Thrombosis    o  o  o     o     o      +  Spontaneous   +  +  +     +     +      P  Phasic        +  +  +     +     +      P  Augmentation  +  +  +     +     +      P  Compressible  +  +  +     +     +      P  Competent     +  +  +     +     +      P   Legend:  + - yes  o - no  p - partial  D - decreased   IMPRESSION:  1. No evidence of deep venous thrombosis noted in the right leg.  2. Right greater saphenous vein appears to be ablated with partial      thrombus in it.    _____________________________  Larina Earthly, M.D.   MG/MEDQ  D:  04/26/2008  T:  04/26/2008  Job:  [id number]

## 2010-11-26 NOTE — Consult Note (Signed)
NEW PATIENT CONSULTATION   Bailey Howard, Bailey Howard H  DOB:  [birth year]                                       10/06/2007  [id number]#:[id number]   Patient presents today for evaluation of bilateral lower extremity  venous varicosities.  Patient is a 65 year old white female with  extensive bilateral lower extremity varicosities.  She is status post  multiple tributary removals by Dr. Marcy Panning around 2000.  She did not  have treatment of her saphenous vein at the time of these phlebectomies.  She has had significant recurrence with marked varicosities over her  bilateral calves and left thigh.  She reports discomfort with bilateral  lower extremity swelling and pain with standing at the end of her day.  Also, specifically over the varicosities.  She does elevate her legs  when possible but has not worn compression garments.  She does not have  any history of bleeding or deep venous thrombosis.   PAST MEDICAL HISTORY:  1. Hyperlipidemia.  2. Allergic rhinitis.  3. Hypertension.  4. Osteoarthritis.   PAST SURGICAL HISTORY:  1. Hysterectomy.  2. Bilateral stripping of her tributary varicosities, as described.  3. Right oophorectomy.  4. Bladder tack x2.   She has no known medical allergies.   MEDICATIONS:  Simvastatin, hydrochlorothiazide.   She does have a family history of varicosities in her mother.   SOCIAL HISTORY:  She does smoke a half pack of cigarettes per day.  She  does work and is single.  She does not drink alcohol.   REVIEW OF SYSTEMS:  Positive only for arthritis and discomfort in her  lower extremities.  She has no cardiac, pulmonary, GI, psychiatric, or  hematologic history.   PHYSICAL EXAMINATION:  A well-developed and well-nourished white female  appearing her stated age of 86.  Blood pressure is 191/95, pulse 95,  respirations 18.  Her lower extremity exams are notable for 2+ dorsalis  pedis pulses bilaterally.  She does have multiple prior scars  from  removal of tributary varicosities and does have marked bilateral lower  extremity varicosities.   She underwent a screening duplex by me, and this revealed gross reflux  and a large saphenous vein bilaterally.  The saphenous vein exits the  fascia in the mid distal thigh on the left and continues below the  fascia on the right with gross reflux bilaterally.  I discussed this at  length with the patient.  I explained that she has had a significant  recurrence due to the nontreatment of her refluxing saphenous vein.   We have fitted her today for thigh-high graduated compression stockings,  20-30 mmHg.  She was instructed on elevation when possible.  She will  continue her diuretic therapy as prescribed by Dr. Jonny Ruiz and will begin  using ibuprofen for discomfort as well.  We will see her again in three  months with formal venous duplex to determined if her conservative  treatment is effective.  I did discuss options of laser ablation of her  saphenous vein for venous hypertension control and stab phlebectomy for  control of her pain.  She feels that she is an excellent candidate for  this.  We will see her again in three months for continued discussion.   Larina Earthly, M.D.  Electronically Signed   TFE/MEDQ  D:  10/06/2007  T:  10/07/2007  Job:  [id number]   cc:   Corwin Levins, MD

## 2010-11-26 NOTE — Procedures (Signed)
LOWER EXTREMITY VENOUS REFLUX EXAM   INDICATION:  Bilateral lower extremity varicose veins.   EXAM:  Using color-flow imaging and pulse Doppler spectral analysis, the  right/left common femoral, superficial femoral, popliteal, posterior  tibial, greater and lesser saphenous veins are evaluated.  There is  evidence suggesting deep venous insufficiency in the right/left common  femoral veins only.   The right/left saphenofemoral junctions are not competent.  The  right/left GSV are not competent with the caliber as described below.   The right/left proximal short saphenous vein demonstrates competency.   GSV Diameter (used if found to be incompetent only)                                            Right    Left  Proximal Greater Saphenous Vein           1.8 cm   1.7 cm  Proximal-to-mid-thigh                     1.0 cm   0.8 cm  Mid thigh                                 0.9 cm   0.8 cm  Mid-distal thigh                          1.0 cm   0.8 cm  Distal thigh                              1.3 cm   0.7 cm  Knee                                      0.9 cm   Tortuous cm   IMPRESSION:  1. Right/Left greater saphenous vein reflux is identified with the      caliber ranging from 1.8 cm to 0.9 cm knee to groin on the right      and from 1.7 cm to 0.7 cm from the saphenofemoral junction to the      distal thigh, where the greater saphenous vein penetrates the      fascia and becomes extremely tortuous just above the knee.  2. The right/left greater saphenous veins are not tortuous in the      proximal to mid thigh.  3. The deep venous system is not competent in the common femoral veins      only.  4. The right/left lesser saphenous veins are competent.  5. The left greater saphenous vein is not aneurysmal.  6. The right greater saphenous vein is slightly dilated at the      saphenofemoral junction.   ___________________________________________  Larina Earthly, M.D.   MC/MEDQ  D:  01/21/2008  T:  01/21/2008  Job:  [id number]

## 2010-11-26 NOTE — Procedures (Signed)
DUPLEX DEEP VENOUS EXAM - LOWER EXTREMITY   INDICATION:  Followup of right greater saphenous vein ablation.   HISTORY:  Edema:  No.  Trauma/Surgery:  Right greater saphenous vein ablation on 02/28/08.  Pain:  No.  PE:  No.  Previous DVT:  No.  Anticoagulants:  Other:   DUPLEX EXAM:                CFV   SFV   PopV  PTV    GSV                R  L  R  L  R  L  R   L  R  L  Thrombosis    o  o  o     o     o      +  Spontaneous   +  +  +     +     +      0  Phasic        +  +  +     +     +      0  Augmentation  +  +  +     +     +      0  Compressible  +  +  +     +     +      0  Competent     0  0  +     +     +      0   Legend:  + - yes  o - no  p - partial  D - decreased   IMPRESSION:  1. No evidence of deep venous thrombosis in right lower extremity;      however, peroneal veins were not fully visualized.  2. Totally occluded right greater saphenous vein from the knee to the      proximal thigh with partial occlusion just distal to the      saphenofemoral junction, status post ablation.  Reflux noted in the      right common femoral vein and at and just distal to the greater      saphenous vein at the saphenofemoral junction.    _____________________________  Larina Earthly, M.D.   PB/MEDQ  D:  03/01/2008  T:  03/01/2008  Job:  [id number]

## 2010-11-26 NOTE — Procedures (Signed)
DUPLEX DEEP VENOUS EXAM - LOWER EXTREMITY   INDICATION:  Follow up left greater saphenous vein ablation and stab  phlebectomy.   HISTORY:  Edema:  Yes.  Trauma/Surgery:  Yes.  Pain:  No.  PE:  No.  Previous DVT:  No.  Anticoagulants:  Other:   DUPLEX EXAM:                CFV   SFV   PopV  PTV    GSV                R  L  R  L  R  L  R   L  R  L  Thrombosis    o  o     o     o      o     +  Spontaneous   +  +     +     +      +     0  Phasic        +  +     +     +      +     0  Augmentation  +  +     +     +      +     0  Compressible  +  +     +     +      +     0  Competent     +  +     +     +      +     0   Legend:  + - yes  o - no  p - partial  D - decreased   IMPRESSION:  1. No evidence of deep venous thrombosis noted in the left leg.  2. The left greater saphenous vein appears ablated from the      saphenofemoral junction to distal thigh level.    _____________________________  Larina Earthly, M.D.   MG/MEDQ  D:  03/29/2008  T:  03/29/2008  Job:  [id number]

## 2010-11-26 NOTE — Assessment & Plan Note (Signed)
OFFICE VISIT   SETSUKO, Bailey Howard  DOB:  August 16, 1945                                       03/01/2008  EAVWU#:[id number]   The patient presents today 1 week followup of laser ablation of her  right great saphenous vein and stab phlebectomy.  She did well with her  initial procedure and has the usual amount of soreness in her thigh.  She has some slight bruising around the stab phlebectomy site too.  She  is quite pleased and reports that her leg feels dramatically better.  She is able to walk without difficulty and is continuing to wear her  compression garment for 1 additional week daytime only.  She is  scheduled to have similar treatment to the contralateral extremity for  the same indication on September 9.  We will arrange treatment at that  time for her left leg.   Larina Earthly, M.D.  Electronically Signed   TFE/MEDQ  D:  03/01/2008  T:  03/02/2008  Job:  [id number]   cc:   Corwin Levins, MD

## 2010-11-28 ENCOUNTER — Ambulatory Visit: Payer: Self-pay | Admitting: Internal Medicine

## 2011-01-24 ENCOUNTER — Encounter: Payer: Self-pay | Admitting: Cardiovascular Disease

## 2011-03-25 HISTORY — PX: ABDOMINAL HERNIA REPAIR: SHX539

## 2011-04-12 ENCOUNTER — Emergency Department: Payer: Self-pay | Admitting: Emergency Medicine

## 2011-04-21 ENCOUNTER — Ambulatory Visit (INDEPENDENT_AMBULATORY_CARE_PROVIDER_SITE_OTHER): Payer: Medicare Other | Admitting: Internal Medicine

## 2011-04-21 ENCOUNTER — Encounter: Payer: Self-pay | Admitting: Internal Medicine

## 2011-04-21 VITALS — BP 124/74 | HR 108 | Temp 98.1°F | Resp 20 | Ht 65.0 in | Wt 207.0 lb

## 2011-04-21 DIAGNOSIS — T8131XA Disruption of external operation (surgical) wound, not elsewhere classified, initial encounter: Secondary | ICD-10-CM

## 2011-04-21 DIAGNOSIS — D649 Anemia, unspecified: Secondary | ICD-10-CM

## 2011-04-21 NOTE — Progress Notes (Signed)
Subjective:    Patient ID: Bailey Howard, female    DOB: Nov 23, 1945, 65 y.o.   MRN: [id number]  HPI Ms. Notte is a 65 year old female with a history of abdominal hernia status post recent repair by plastic surgery at [facility] on 03/25/2011. She reports that she was discharged from the hospital and sent to a rehabilitation facility where she stayed until a couple of days ago. Since that time she has been relying on her family members to change her dressings. She reports that there were 2 Drains in Pl. in her abdomen which fell out a couple of days ago. She was seen at the ER and told to leave the drains out. She has followup with her plastic surgeon on 05/08/2011. She denies any fever or chills. She reports diffuse leakage of clear yellow fluid from her abdomen. She reports limited urine output. She reports some constipation.  Outpatient Encounter Prescriptions as of 04/21/2011  Medication Sig Dispense Refill  . aspirin 81 MG EC tablet Take 81 mg by mouth daily.        . cephALEXin (KEFLEX) 500 MG capsule Take 500 mg by mouth every 6 (six) hours.       . Cholecalciferol (VITAMIN D3) 1000 UNITS CAPS Take 1 capsule by mouth every 30 (thirty) days.        . Cranberry 650 MG CAPS Take 1 capsule by mouth daily. Dosage?       . lisinopril-hydrochlorothiazide (PRINZIDE,ZESTORETIC) 20-25 MG per tablet Take 1 tablet by mouth daily.        . meloxicam (MOBIC) 15 MG tablet Take 15 mg by mouth every other day.        . metoprolol tartrate (LOPRESSOR) 25 MG tablet Take 37.5 mg by mouth 2 (two) times daily.        . Misc Natural Products (GLUCOSAMINE CHONDROITIN ADV PO) Take 1 tablet by mouth daily.        . Multiple Vitamins-Minerals (CENTRUM SILVER PO) Take 1 tablet by mouth daily.        . niacin (NIASPAN) 1000 MG CR tablet Take 1,000 mg by mouth daily.        . Omega-3 Fatty Acids (FISH OIL) 1200 MG CAPS Take 1 capsule by mouth daily.        . ondansetron (ZOFRAN) 8 MG tablet Take 8 mg by  mouth 3 (three) times daily.       . simvastatin (ZOCOR) 10 MG tablet Take 10 mg by mouth at bedtime.        . vitamin C (ASCORBIC ACID) 500 MG tablet Take 500 mg by mouth daily.        . vitamin E 400 UNIT capsule Take 400 Units by mouth daily.          Review of Systems  Constitutional: Positive for fatigue. Negative for fever, chills and diaphoresis.  Respiratory: Negative for shortness of breath.   Cardiovascular: Negative for chest pain and leg swelling.  Gastrointestinal: Positive for abdominal pain, constipation and abdominal distention. Negative for blood in stool.  Genitourinary: Positive for decreased urine volume.   BP 124/74  Pulse 108  Temp(Src) 98.1 F (36.7 C) (Oral)  Resp 20  Ht 5\' 5"  (1.651 m)  Wt 207 lb (93.895 kg)  BMI 34.45 kg/m2  SpO2 95%     Objective:   Physical Exam  Constitutional: She is oriented to person, place, and time. She appears well-developed and well-nourished. No distress.  HENT:  Head: Normocephalic  and atraumatic.  Right Ear: External ear normal.  Left Ear: External ear normal.  Nose: Nose normal.  Mouth/Throat: Oropharynx is clear and moist. No oropharyngeal exudate.  Eyes: Conjunctivae are normal. Pupils are equal, round, and reactive to light. Right eye exhibits no discharge. Left eye exhibits no discharge. No scleral icterus.  Neck: Normal range of motion. Neck supple. No tracheal deviation present. No thyromegaly present.  Cardiovascular: Normal rate, regular rhythm, normal heart sounds and intact distal pulses.  Exam reveals no gallop and no friction rub.   No murmur heard. Pulmonary/Chest: Effort normal and breath sounds normal. No respiratory distress. She has no wheezes. She has no rales. She exhibits no tenderness.  Abdominal: She exhibits distension. There is tenderness.    Musculoskeletal: Normal range of motion. She exhibits no edema and no tenderness.  Lymphadenopathy:    She has no cervical adenopathy.  Neurological: She  is alert and oriented to person, place, and time. No cranial nerve deficit. She exhibits normal muscle tone. Coordination normal.  Skin: Skin is warm and dry. No rash noted. She is not diaphoretic. No erythema. No pallor.  Psychiatric: She has a normal mood and affect. Her behavior is normal. Judgment and thought content normal.          Assessment & Plan:  1. Abdominal Wound - patient with extensive abdominal wound after attempt at hernia repair on 03/25/2011. I am very concerned about her open wound particularly the area which is open and the superior aspect of the wound and tunnels deeply into the wound. Question if she may need wound VAC placement and/or debridement. We have called her plastic surgeon and set her up an appointment with him tomorrow. We have also contacted home health and try to set up wound care. She will followup in our clinic in one week. Her wound was packed today with a dry gauze.

## 2011-04-22 LAB — CBC WITH DIFFERENTIAL/PLATELET
Basophils Absolute: 0 10*3/uL (ref 0.0–0.1)
Eosinophils Absolute: 0.2 10*3/uL (ref 0.0–0.7)
Lymphocytes Relative: 17.3 % (ref 12.0–46.0)
MCHC: 33.5 g/dL (ref 30.0–36.0)
MCV: 93.5 fl (ref 78.0–100.0)
Monocytes Absolute: 0.4 10*3/uL (ref 0.1–1.0)
Neutrophils Relative %: 73.4 % (ref 43.0–77.0)
Platelets: 447 10*3/uL — ABNORMAL HIGH (ref 150.0–400.0)
RBC: 3.3 Mil/uL — ABNORMAL LOW (ref 3.87–5.11)
RDW: 13.6 % (ref 11.5–14.6)

## 2011-04-22 LAB — COMPREHENSIVE METABOLIC PANEL
AST: 20 U/L (ref 0–37)
Albumin: 3.2 g/dL — ABNORMAL LOW (ref 3.5–5.2)
Alkaline Phosphatase: 76 U/L (ref 39–117)
BUN: 13 mg/dL (ref 6–23)
Calcium: 8.5 mg/dL (ref 8.4–10.5)
Chloride: 105 mEq/L (ref 96–112)
Potassium: 3.4 mEq/L — ABNORMAL LOW (ref 3.5–5.1)
Sodium: 143 mEq/L (ref 135–145)
Total Protein: 6.2 g/dL (ref 6.0–8.3)

## 2011-04-23 ENCOUNTER — Telehealth: Payer: Self-pay | Admitting: Internal Medicine

## 2011-04-23 NOTE — Telephone Encounter (Signed)
As long as she has home health set up, I don't think she has to come in. If she needs anything,then we can try to help her out. I know that it would be very difficult for her family to bring her in.  She can follow up with the surgeon next week, then see [place] the week after.

## 2011-04-23 NOTE — Telephone Encounter (Signed)
Patient called this morning and stated that last night they gave her fluids because she was dehydrated and Rx her medication for low potassium.    When they discharged her they want her to f/up with you 2 days but she wanted to know if the appointment with you was necessary because she has to see Dr. Janee Morn on Monday the surgeon.

## 2011-04-25 NOTE — Telephone Encounter (Signed)
Informed pt that as long as home health is coming out and we don't think she needs to come. But if she needs anything else we can try and help.  She will see [place] after she follows up with the surgeon.

## 2011-04-29 ENCOUNTER — Inpatient Hospital Stay: Payer: Self-pay | Admitting: Internal Medicine

## 2011-05-05 ENCOUNTER — Telehealth: Payer: Self-pay | Admitting: Internal Medicine

## 2011-05-05 NOTE — Telephone Encounter (Signed)
This should have been set up on discharge. If not, we can reorder [facility].

## 2011-05-05 NOTE — Telephone Encounter (Signed)
Debra from [facility] called and stated patient was discharged today from the hospital today and they recommended PT and homehealth nurse?  Please advise.

## 2011-05-06 NOTE — Telephone Encounter (Signed)
Informed Bailey Howard that is was ok to set her up for PT, Social and home health aid.

## 2011-05-15 ENCOUNTER — Other Ambulatory Visit: Payer: Self-pay | Admitting: Internal Medicine

## 2011-05-16 ENCOUNTER — Ambulatory Visit (INDEPENDENT_AMBULATORY_CARE_PROVIDER_SITE_OTHER): Payer: Medicare Other | Admitting: Internal Medicine

## 2011-05-16 ENCOUNTER — Encounter: Payer: Self-pay | Admitting: Internal Medicine

## 2011-05-16 VITALS — BP 140/84 | HR 110 | Temp 98.8°F | Resp 20 | Ht 65.0 in | Wt 176.0 lb

## 2011-05-16 DIAGNOSIS — R110 Nausea: Secondary | ICD-10-CM

## 2011-05-16 DIAGNOSIS — T8130XA Disruption of wound, unspecified, initial encounter: Secondary | ICD-10-CM

## 2011-05-16 DIAGNOSIS — I10 Essential (primary) hypertension: Secondary | ICD-10-CM

## 2011-05-16 DIAGNOSIS — R5381 Other malaise: Secondary | ICD-10-CM

## 2011-05-16 DIAGNOSIS — R5383 Other fatigue: Secondary | ICD-10-CM

## 2011-05-16 DIAGNOSIS — R197 Diarrhea, unspecified: Secondary | ICD-10-CM

## 2011-05-16 LAB — COMPREHENSIVE METABOLIC PANEL
ALT: <8 U/L (ref 0–35)
AST: 25 U/L (ref 0–37)
Albumin: 3.2 g/dL — ABNORMAL LOW (ref 3.5–5.2)
Alkaline Phosphatase: 88 U/L (ref 39–117)
BUN: 14 mg/dL (ref 6–23)
Calcium: 8.6 mg/dL (ref 8.4–10.5)
Chloride: 107 mEq/L (ref 96–112)
Creat: 1.13 mg/dL — ABNORMAL HIGH (ref 0.50–1.10)
Potassium: 4.4 mEq/L (ref 3.5–5.3)

## 2011-05-16 MED ORDER — OMEPRAZOLE 40 MG PO CPDR: 40.0000 mg | DELAYED_RELEASE_CAPSULE | Freq: Every day | ORAL | Status: DC

## 2011-05-16 MED ORDER — PROMETHAZINE HCL 12.5 MG PO TABS: 12.5000 mg | ORAL_TABLET | Freq: Four times a day (QID) | ORAL | Status: DC | PRN

## 2011-05-16 NOTE — Progress Notes (Signed)
Subjective:    Patient ID: Bailey Howard, female    DOB: [birth year], 65 y.o.   MRN: [id number]  HPI  65 year old female with a history of hypertension and hyperlipidemia as well as recent repair of a large ventral wall hernia presents for followup. Repair of her ventral wall hernia was complicated diarrhea opening of her abdominal wound and rehospitalization for wound infection. She has recently completed her antibiotics and was started on wound VAC therapy. She reports persistent severe fatigue. She reports diminished appetite. She reports frequent nausea which is not responsive to Zofran. She notes some episodes of diarrhea which have been watery but none within the last couple of days. She denies any fever or chills. She reports that the pain of her abdominal wound is well controlled with current medications. She continues to receive assistance from home health as well as her family members.   Outpatient Encounter Prescriptions as of 05/16/2011  Medication Sig Dispense Refill  . aspirin 81 MG EC tablet Take 81 mg by mouth daily.        . Cholecalciferol (VITAMIN D3) 1000 UNITS CAPS Take 1 capsule by mouth every 30 (thirty) days.        . Cranberry 650 MG CAPS Take 1 capsule by mouth daily. Dosage?       . lisinopril-hydrochlorothiazide (PRINZIDE,ZESTORETIC) 20-25 MG per tablet take 1 tablet by mouth once daily  90 tablet  3  . meloxicam (MOBIC) 15 MG tablet Take 15 mg by mouth every other day.        . metoprolol tartrate (LOPRESSOR) 25 MG tablet Take 37.5 mg by mouth 2 (two) times daily.        . Misc Natural Products (GLUCOSAMINE CHONDROITIN ADV PO) Take 1 tablet by mouth daily.        . Multiple Vitamins-Minerals (CENTRUM SILVER PO) Take 1 tablet by mouth daily.        . niacin (NIASPAN) 1000 MG CR tablet Take 1,000 mg by mouth daily.        . Omega-3 Fatty Acids (FISH OIL) 1200 MG CAPS Take 1 capsule by mouth daily.        . simvastatin (ZOCOR) 10 MG tablet Take 10 mg by mouth at bedtime.         . vitamin C (ASCORBIC ACID) 500 MG tablet Take 500 mg by mouth daily.        . vitamin E 400 UNIT capsule Take 400 Units by mouth daily.        . cephALEXin (KEFLEX) 500 MG capsule Take 500 mg by mouth every 6 (six) hours.       Marland Kitchen omeprazole (PRILOSEC) 40 MG capsule Take 1 capsule (40 mg total) by mouth daily.  30 capsule  1  . ondansetron (ZOFRAN) 8 MG tablet Take 8 mg by mouth 3 (three) times daily.       . promethazine (PHENERGAN) 12.5 MG tablet Take 1 tablet (12.5 mg total) by mouth every 6 (six) hours as needed for nausea.  60 tablet  0    Review of Systems  Constitutional: Positive for chills, diaphoresis and fatigue. Negative for fever.  Respiratory: Positive for shortness of breath. Negative for cough.   Gastrointestinal: Positive for nausea, vomiting, abdominal pain, diarrhea and abdominal distention. Negative for constipation.   BP 140/84  Pulse 110  Temp(Src) 98.8 F (37.1 C) (Oral)  Resp 20  Ht 5\' 5"  (1.651 m)  Wt 176 lb (79.833 kg)  BMI 29.29 kg/m2  SpO2  99%     Objective:   Physical Exam  Constitutional: She is oriented to person, place, and time. She appears well-developed and well-nourished. No distress.  HENT:  Head: Normocephalic and atraumatic.  Right Ear: External ear normal.  Left Ear: External ear normal.  Nose: Nose normal.  Mouth/Throat: Oropharynx is clear and moist. No oropharyngeal exudate.  Eyes: Conjunctivae are normal. Pupils are equal, round, and reactive to light. Right eye exhibits no discharge. Left eye exhibits no discharge. No scleral icterus.  Neck: Normal range of motion. Neck supple. No tracheal deviation present. No thyromegaly present.  Cardiovascular: Regular rhythm, normal heart sounds and intact distal pulses.  Tachycardia present.  Exam reveals no gallop and no friction rub.   No murmur heard. Pulmonary/Chest: Effort normal and breath sounds normal. No respiratory distress. She has no wheezes. She has no rales. She exhibits no  tenderness.  Abdominal: There is generalized tenderness.    Musculoskeletal: Normal range of motion. She exhibits no edema and no tenderness.  Lymphadenopathy:    She has no cervical adenopathy.  Neurological: She is alert and oriented to person, place, and time. No cranial nerve deficit. She exhibits normal muscle tone. Coordination normal.  Skin: Skin is warm and dry. No rash noted. She is not diaphoretic. No erythema. No pallor.  Psychiatric: She has a normal mood and affect. Her behavior is normal. Judgment and thought content normal.          Assessment & Plan:  1. Fatigue -likely multifactorial. Patient had extensive surgery complicated by wound infection and now has open abdominal wound. She has significant drainage from this wound and is likely protein malnourished. She is also dehydrated and tachycardic. We'll check CBC and  CMP with labs today. Encouraged her to increase protein intake in her diet. She does not like the taste of boost or Ensure, so encouraged her to increase intake of high-protein foods such as peanut butter and beans or lean meat such as chicken. Also encouraged increased fluid intake. She will followup in one month.  2. Nausea -nausea likely secondary to abdominal wound. Will check CMP with labs today. Given that she had no improvement with Zofran we'll try using Phenergan. She will call if symptoms are not improving. Encouraged her to be cautious with the use of Phenergan because of its ability to cause sedation.  3. Diarrhea - symptoms of diarrhea are improving. Patient is at some risk for C. difficile infection because of use of numerous antibiotics. If her diarrhea recurs will send stool for C. difficile testing.  4. Open abdominal wound - patient continues to have an open abdominal wound with profuse drainage. She now has a wound VAC in place and appears to be doing much better than we saw her one month ago. Encouraged her to call immediately should she  develop fever or chills. As above, encouraged her to increase her protein intake. Follow up 1 month.  5. Hypertension - Will NOT restart Lisinopril-HCTZ at this time, as pt dehydrated. Will check renal function with labs today.

## 2011-05-17 LAB — CBC WITH DIFFERENTIAL/PLATELET
Basophils Absolute: 0.1 10*3/uL (ref 0.0–0.1)
Basophils Relative: 0 % (ref 0–1)
Eosinophils Absolute: 0.1 10*3/uL (ref 0.0–0.7)
Eosinophils Relative: 0 % (ref 0–5)
HCT: 29.9 % — ABNORMAL LOW (ref 36.0–46.0)
Hemoglobin: 9 g/dL — ABNORMAL LOW (ref 12.0–15.0)
MCH: 27.4 pg (ref 26.0–34.0)
MCHC: 30.1 g/dL (ref 30.0–36.0)
Monocytes Absolute: 1.1 10*3/uL — ABNORMAL HIGH (ref 0.1–1.0)
Monocytes Relative: 5 % (ref 3–12)
RDW: 16.7 % — ABNORMAL HIGH (ref 11.5–15.5)

## 2011-05-20 ENCOUNTER — Telehealth: Payer: Self-pay | Admitting: *Deleted

## 2011-05-20 NOTE — Telephone Encounter (Signed)
Left detailed VM for RN

## 2011-05-20 NOTE — Telephone Encounter (Signed)
Home health RN called. She went to see patient yesterday and is worried about increasing size of wound. Per RN, was size of lemon Friday but now orange. There is no pain or any signs of infection. Due to increase in size do you want to go forward with imaging? Also, repeat labs will be drawn by home health. They already go to pt's home on M,W & Fri's. I advised her that she could do labs wed or fri as long as we had results by end of day Friday for review.

## 2011-05-20 NOTE — Telephone Encounter (Signed)
Home health needs to contact patients plastic surgeon for evaluation if wound is increasing in size.

## 2011-05-21 ENCOUNTER — Telehealth: Payer: Self-pay | Admitting: *Deleted

## 2011-05-21 NOTE — Telephone Encounter (Signed)
FYI - pt was seen by plastic surgeon today and they drained large abscess.

## 2011-05-23 ENCOUNTER — Other Ambulatory Visit: Payer: Medicare Other

## 2011-05-28 ENCOUNTER — Telehealth: Payer: Self-pay | Admitting: Internal Medicine

## 2011-05-28 NOTE — Telephone Encounter (Signed)
[phone]  Ms Bailey Howard sister Bailey Howard) came in and wanted to get lab results for the labs she had done last week these labs were sent to [facility]. Ms Bailey Howard has an appointment tomorrow with [facility]  Dr. Garnette Gunner

## 2011-06-11 ENCOUNTER — Encounter: Payer: Self-pay | Admitting: Internal Medicine

## 2011-06-16 ENCOUNTER — Encounter: Payer: Self-pay | Admitting: Internal Medicine

## 2011-06-16 ENCOUNTER — Ambulatory Visit (INDEPENDENT_AMBULATORY_CARE_PROVIDER_SITE_OTHER): Payer: Medicare Other | Admitting: Internal Medicine

## 2011-06-16 DIAGNOSIS — Z0000 Encounter for general adult medical examination without abnormal findings: Secondary | ICD-10-CM

## 2011-06-16 DIAGNOSIS — I10 Essential (primary) hypertension: Secondary | ICD-10-CM | POA: Insufficient documentation

## 2011-06-16 DIAGNOSIS — S31109A Unspecified open wound of abdominal wall, unspecified quadrant without penetration into peritoneal cavity, initial encounter: Secondary | ICD-10-CM | POA: Insufficient documentation

## 2011-06-16 MED ORDER — LISINOPRIL-HYDROCHLOROTHIAZIDE 10-12.5 MG PO TABS: 1.0000 | ORAL_TABLET | Freq: Every day | ORAL | Status: DC

## 2011-06-16 NOTE — Progress Notes (Signed)
Subjective:    Patient ID: Bailey Howard, female    DOB: 1946-01-12, 65 y.o.   MRN: [id number]  HPI 65 year old female who is status post repair of an abdominal hernia presents for followup. She recently completed use of her wound VAC. She has done extremely well and reports that wound is no longer draining any fluid. The wound is almost completely closed except for a. Small open area at the inferior edge of the wound. She continues to be followed by home health who has been dressing the lower area of the wound during the week. She has been discharged from her plastic surgeon. She has not had any more abdominal pain. She did have an episode of 3 days of diarrhea last week, but attributes this to food that she ate. Her appetite and energy level are much improved. She occasionally takes medication for nausea but not regular basis. She denies any complaints today. She denies any fever or chills  She notes that her blood pressure has been elevated recently. Her lisinopril and hydrochlorothiazide were stopped in the setting of a wound infection and renal failure. She notes over the last several weeks her systolic blood pressure has been as high as 180. She denies any headache or palpitations. She denies any chest pain. She questions whether she should restart her blood pressure medicine.    Outpatient Encounter Prescriptions as of 06/16/2011  Medication Sig Dispense Refill  . aspirin 81 MG EC tablet Take 81 mg by mouth daily.        . Cholecalciferol (VITAMIN D3) 1000 UNITS CAPS Take 1 capsule by mouth every 30 (thirty) days.        . Cranberry 650 MG CAPS Take 1 capsule by mouth daily. Dosage?       . meloxicam (MOBIC) 15 MG tablet Take 15 mg by mouth every other day.        . metoprolol (LOPRESSOR) 50 MG tablet Take 50 mg by mouth 2 (two) times daily.        . Misc Natural Products (GLUCOSAMINE CHONDROITIN ADV PO) Take 1 tablet by mouth daily.        . Multiple Vitamins-Minerals (CENTRUM SILVER PO)  Take 1 tablet by mouth daily.        . niacin (NIASPAN) 1000 MG CR tablet Take 1,000 mg by mouth daily.        . Omega-3 Fatty Acids (FISH OIL) 1200 MG CAPS Take 1 capsule by mouth daily.        Marland Kitchen omeprazole (PRILOSEC) 40 MG capsule Take 1 capsule (40 mg total) by mouth daily.  30 capsule  1  . ondansetron (ZOFRAN) 8 MG tablet Take 8 mg by mouth 3 (three) times daily.       . simvastatin (ZOCOR) 10 MG tablet Take 10 mg by mouth at bedtime.        . vitamin C (ASCORBIC ACID) 500 MG tablet Take 500 mg by mouth daily.        . vitamin E 400 UNIT capsule Take 400 Units by mouth daily.           Review of Systems  Constitutional: Negative for fever, chills, appetite change, fatigue and unexpected weight change.  HENT: Negative for ear pain, congestion, sore throat, trouble swallowing, neck pain, voice change and sinus pressure.   Eyes: Negative for visual disturbance.  Respiratory: Negative for cough, shortness of breath, wheezing and stridor.   Cardiovascular: Negative for chest pain, palpitations and leg swelling.  Gastrointestinal: Positive for abdominal pain and diarrhea (few days ago, now resolved). Negative for nausea, vomiting, constipation, blood in stool, abdominal distention and anal bleeding.  Genitourinary: Negative for dysuria and flank pain.  Musculoskeletal: Negative for myalgias, arthralgias and gait problem.  Skin: Negative for color change and rash.  Neurological: Negative for dizziness and headaches.  Hematological: Negative for adenopathy. Does not bruise/bleed easily.  Psychiatric/Behavioral: Negative for suicidal ideas, sleep disturbance and dysphoric mood. The patient is not nervous/anxious.    BP 158/70  Pulse 89  Temp(Src) 97.9 F (36.6 C) (Oral)  Wt 205 lb (92.987 kg)  SpO2 95%     Objective:   Physical Exam  Constitutional: She is oriented to person, place, and time. She appears well-developed and well-nourished. No distress.  HENT:  Head: Normocephalic and  atraumatic.  Right Ear: External ear normal.  Left Ear: External ear normal.  Nose: Nose normal.  Mouth/Throat: Oropharynx is clear and moist. No oropharyngeal exudate.  Eyes: Conjunctivae are normal. Pupils are equal, round, and reactive to light. Right eye exhibits no discharge. Left eye exhibits no discharge. No scleral icterus.  Neck: Normal range of motion. Neck supple. No tracheal deviation present. No thyromegaly present.  Cardiovascular: Normal rate, regular rhythm, normal heart sounds and intact distal pulses.  Exam reveals no gallop and no friction rub.   No murmur heard. Pulmonary/Chest: Effort normal and breath sounds normal. No respiratory distress. She has no wheezes. She has no rales. She exhibits no tenderness.  Abdominal: Soft. Bowel sounds are normal. She exhibits distension. There is tenderness.    Musculoskeletal: Normal range of motion. She exhibits no edema and no tenderness.  Lymphadenopathy:    She has no cervical adenopathy.  Neurological: She is alert and oriented to person, place, and time. No cranial nerve deficit. She exhibits normal muscle tone. Coordination normal.  Skin: Skin is warm and dry. No rash noted. She is not diaphoretic. No erythema. No pallor.  Psychiatric: She has a normal mood and affect. Her behavior is normal. Judgment and thought content normal.          Assessment & Plan:  1. Abdominal wound -wound is healing very well. We'll continue with home health until inferior edge of the wound is closed. She will call if there are any problems.  2. Hypertension - blood pressure slightly elevated today and elevated in general recently per patient report. Will resume lisinopril hydrochlorothiazide as last renal function test was normal and she is recovered from her episode of dehydration. We will recheck her kidney function including creatinine and potassium in one week. She will followup in one month.

## 2011-06-16 NOTE — Patient Instructions (Signed)
Labs in 1 week.  Follow up in 1 month. 

## 2011-06-24 ENCOUNTER — Other Ambulatory Visit: Payer: Medicare Other

## 2011-06-25 ENCOUNTER — Other Ambulatory Visit (INDEPENDENT_AMBULATORY_CARE_PROVIDER_SITE_OTHER): Payer: Medicare Other | Admitting: *Deleted

## 2011-06-25 DIAGNOSIS — Z79899 Other long term (current) drug therapy: Secondary | ICD-10-CM

## 2011-06-25 DIAGNOSIS — Z0000 Encounter for general adult medical examination without abnormal findings: Secondary | ICD-10-CM

## 2011-06-25 DIAGNOSIS — I10 Essential (primary) hypertension: Secondary | ICD-10-CM

## 2011-06-25 LAB — LDL CHOLESTEROL, DIRECT: Direct LDL: 49.3 mg/dL

## 2011-06-25 LAB — COMPREHENSIVE METABOLIC PANEL
AST: 19 U/L (ref 0–37)
Albumin: 3.9 g/dL (ref 3.5–5.2)
Alkaline Phosphatase: 80 U/L (ref 39–117)
BUN: 13 mg/dL (ref 6–23)
Creatinine, Ser: 0.6 mg/dL (ref 0.4–1.2)
Glucose, Bld: 92 mg/dL (ref 70–99)
Total Bilirubin: 0.9 mg/dL (ref 0.3–1.2)

## 2011-06-25 LAB — CBC WITH DIFFERENTIAL/PLATELET
Eosinophils Relative: 1.5 % (ref 0.0–5.0)
HCT: 33.1 % — ABNORMAL LOW (ref 36.0–46.0)
Lymphs Abs: 1.2 10*3/uL (ref 0.7–4.0)
Monocytes Relative: 7 % (ref 3.0–12.0)
Neutrophils Relative %: 75.4 % (ref 43.0–77.0)
Platelets: 382 10*3/uL (ref 150.0–400.0)
RBC: 3.89 Mil/uL (ref 3.87–5.11)
WBC: 7.4 10*3/uL (ref 4.5–10.5)

## 2011-06-25 LAB — LIPID PANEL
Total CHOL/HDL Ratio: 3
Triglycerides: 234 mg/dL — ABNORMAL HIGH (ref 0.0–149.0)

## 2011-07-15 DIAGNOSIS — N63 Unspecified lump in unspecified breast: Secondary | ICD-10-CM

## 2011-07-15 HISTORY — DX: Unspecified lump in unspecified breast: N63.0

## 2011-07-15 HISTORY — PX: PORT A CATH REVISION: SHX6033

## 2011-07-15 HISTORY — PX: COLONOSCOPY: SHX174

## 2011-07-22 ENCOUNTER — Telehealth: Payer: Self-pay | Admitting: Internal Medicine

## 2011-07-22 NOTE — Telephone Encounter (Signed)
[phone] Pt called wanted to get a letter from Dr Dan Humphreys stated that she is unable to go to jury duty.  Pt stated she is still having problems walking with cane/  She needs to send the letter in within 5 days She also has problems setting for long periods of time

## 2011-07-22 NOTE — Telephone Encounter (Deleted)
OK for letter

## 2011-07-22 NOTE — Telephone Encounter (Signed)
Fine to send this. Patient just had extensive abdominal surgery and cannot sit for prolonged periods.

## 2011-07-23 ENCOUNTER — Ambulatory Visit: Payer: Medicare Other | Admitting: Internal Medicine

## 2011-07-23 NOTE — Telephone Encounter (Signed)
Letter completed, need to print tomorrow for MD to sign.

## 2011-07-24 ENCOUNTER — Encounter: Payer: Self-pay | Admitting: Internal Medicine

## 2011-07-24 ENCOUNTER — Ambulatory Visit (INDEPENDENT_AMBULATORY_CARE_PROVIDER_SITE_OTHER): Payer: Medicare Other | Admitting: Internal Medicine

## 2011-07-24 VITALS — BP 148/78 | HR 85 | Temp 97.7°F

## 2011-07-24 DIAGNOSIS — N390 Urinary tract infection, site not specified: Secondary | ICD-10-CM

## 2011-07-24 LAB — POCT URINALYSIS DIPSTICK
Bilirubin, UA: NEGATIVE
Glucose, UA: NEGATIVE
Nitrite, UA: POSITIVE

## 2011-07-24 MED ORDER — SULFAMETHOXAZOLE-TRIMETHOPRIM 800-160 MG PO TABS: 1.0000 | ORAL_TABLET | Freq: Two times a day (BID) | ORAL | Status: AC

## 2011-07-24 NOTE — Progress Notes (Signed)
Subjective:    Patient ID: Bailey Howard, female    DOB: 12/08/1945, 66 y.o.   MRN: [id number]  HPI 66YO female presents with 2-3 day h/o dysuria, increased urinary frequency and increased low back pain. Denies fever, chills, flank pain. No hematuria noted. Has not taken any medication for symptoms.  Outpatient Encounter Prescriptions as of 07/24/2011  Medication Sig Dispense Refill  . aspirin 81 MG EC tablet Take 81 mg by mouth daily.        . Cholecalciferol (VITAMIN D3) 1000 UNITS CAPS Take 1 capsule by mouth every 30 (thirty) days.        . Cranberry 650 MG CAPS Take 1 capsule by mouth daily. Dosage?       . lisinopril-hydrochlorothiazide (PRINZIDE,ZESTORETIC) 10-12.5 MG per tablet Take 1 tablet by mouth daily.  30 tablet  6  . meloxicam (MOBIC) 15 MG tablet Take 15 mg by mouth every other day.        . metoprolol (LOPRESSOR) 50 MG tablet Take 50 mg by mouth 2 (two) times daily.        . Misc Natural Products (GLUCOSAMINE CHONDROITIN ADV PO) Take 1 tablet by mouth daily.        . Multiple Vitamins-Minerals (CENTRUM SILVER PO) Take 1 tablet by mouth daily.        . niacin (NIASPAN) 1000 MG CR tablet Take 1,000 mg by mouth daily.        . Omega-3 Fatty Acids (FISH OIL) 1200 MG CAPS Take 1 capsule by mouth daily.        Marland Kitchen omeprazole (PRILOSEC) 40 MG capsule Take 1 capsule (40 mg total) by mouth daily.  30 capsule  1  . ondansetron (ZOFRAN) 8 MG tablet Take 8 mg by mouth 3 (three) times daily.       . simvastatin (ZOCOR) 10 MG tablet Take 10 mg by mouth at bedtime.        . vitamin C (ASCORBIC ACID) 500 MG tablet Take 500 mg by mouth daily.        . vitamin E 400 UNIT capsule Take 400 Units by mouth daily.        Marland Kitchen sulfamethoxazole-trimethoprim (SEPTRA DS) 800-160 MG per tablet Take 1 tablet by mouth 2 (two) times daily.  14 tablet  0    Review of Systems  Constitutional: Negative for fever, chills and fatigue.  Gastrointestinal: Negative for nausea, vomiting, abdominal pain, diarrhea,  constipation and rectal pain.  Genitourinary: Positive for dysuria and urgency. Negative for frequency, hematuria, flank pain, decreased urine volume, vaginal bleeding, vaginal discharge, difficulty urinating, vaginal pain and pelvic pain.  Musculoskeletal: Positive for back pain.   BP 148/78  Pulse 85  Temp(Src) 97.7 F (36.5 C) (Oral)  SpO2 95%     Objective:   Physical Exam  Constitutional: She is oriented to person, place, and time. She appears well-developed and well-nourished. No distress.  HENT:  Head: Normocephalic and atraumatic.  Right Ear: External ear normal.  Left Ear: External ear normal.  Nose: Nose normal.  Mouth/Throat: Oropharynx is clear and moist. No oropharyngeal exudate.  Eyes: Conjunctivae are normal. Pupils are equal, round, and reactive to light. Right eye exhibits no discharge. Left eye exhibits no discharge. No scleral icterus.  Neck: Normal range of motion. Neck supple. No tracheal deviation present. No thyromegaly present.  Cardiovascular: Normal rate, regular rhythm, normal heart sounds and intact distal pulses.  Exam reveals no gallop and no friction rub.   No murmur heard.  Pulmonary/Chest: Effort normal and breath sounds normal. No respiratory distress. She has no wheezes. She has no rales. She exhibits no tenderness.  Abdominal: Soft. There is no tenderness. There is no CVA tenderness.  Musculoskeletal: Normal range of motion. She exhibits no edema and no tenderness.  Lymphadenopathy:    She has no cervical adenopathy.  Neurological: She is alert and oriented to person, place, and time. No cranial nerve deficit. She exhibits normal muscle tone. Coordination normal.  Skin: Skin is warm and dry. No rash noted. She is not diaphoretic. No erythema. No pallor.  Psychiatric: She has a normal mood and affect. Her behavior is normal. Judgment and thought content normal.          Assessment & Plan:  1. UTI - Will send urine for culture. Will treat with  Bactrim DS bid x 7 days. Follow up if symptoms worsening or not improving.

## 2011-07-24 NOTE — Telephone Encounter (Signed)
Letter ready for pick up. Patient informed. She also c/o lbp and concerned she may have a uti. Scheduled for quick visit today for u/a

## 2011-07-27 LAB — URINE CULTURE: Colony Count: >=100000

## 2011-07-28 ENCOUNTER — Other Ambulatory Visit: Payer: Self-pay | Admitting: Internal Medicine

## 2011-08-08 ENCOUNTER — Ambulatory Visit (INDEPENDENT_AMBULATORY_CARE_PROVIDER_SITE_OTHER): Payer: Medicare Other | Admitting: Internal Medicine

## 2011-08-08 ENCOUNTER — Other Ambulatory Visit: Payer: Self-pay | Admitting: Internal Medicine

## 2011-08-08 ENCOUNTER — Encounter: Payer: Self-pay | Admitting: Internal Medicine

## 2011-08-08 VITALS — BP 140/60 | HR 72 | Temp 98.2°F | Ht 65.0 in | Wt 183.0 lb

## 2011-08-08 DIAGNOSIS — L03319 Cellulitis of trunk, unspecified: Secondary | ICD-10-CM

## 2011-08-08 DIAGNOSIS — R110 Nausea: Secondary | ICD-10-CM

## 2011-08-08 DIAGNOSIS — L02219 Cutaneous abscess of trunk, unspecified: Secondary | ICD-10-CM

## 2011-08-08 MED ORDER — PROMETHAZINE HCL 12.5 MG PO TABS: 12.5000 mg | ORAL_TABLET | Freq: Four times a day (QID) | ORAL | Status: DC | PRN

## 2011-08-08 MED ORDER — SULFAMETHOXAZOLE-TRIMETHOPRIM 800-160 MG PO TABS: 1.0000 | ORAL_TABLET | Freq: Two times a day (BID) | ORAL | Status: AC

## 2011-08-08 NOTE — Assessment & Plan Note (Signed)
Patient with persistent nausea in the setting of recent urinary tract infection and cellulitis and abscess of her abdomen. She reports improvement in her symptoms with use of Phenergan. Will refill medication today. Followup one month. She will call sooner if she develops fever or chills.

## 2011-08-08 NOTE — Assessment & Plan Note (Signed)
Patient has recurrent abscess and surrounding cellulitis at the left side of her abdominal wound. The area is currently draining. We'll have her set up followup next week with her surgeon. Culture sent from wound today. Will start her back on Bactrim. Followup here in one month.

## 2011-08-08 NOTE — Progress Notes (Signed)
Subjective:    Patient ID: Bailey Howard, female    DOB: 06-26-1946, 66 y.o.   MRN: [id number]  HPI 66 year old female with history of abdominal hernia status post repair presents for followup. She reports that generally she has been doing well. However, she notes recurrence of the abscess on the left side of her abdominal wound. She notes it has been draining purulent fluid. She notes that it is tender to palpation. She has not had any fever or chills. She has had some nausea. This is particularly bad 2 weeks ago when she had a urinary tract infection as well. She has been taking Phenergan for her nausea with improvement. She denies any vomiting. She denies any diarrhea.  Outpatient Encounter Prescriptions as of 08/08/2011  Medication Sig Dispense Refill  . aspirin 81 MG EC tablet Take 81 mg by mouth daily.        . Cholecalciferol (VITAMIN D3) 1000 UNITS CAPS Take 1 capsule by mouth every 30 (thirty) days.        . Cranberry 650 MG CAPS Take 1 capsule by mouth daily. Dosage?       . lisinopril-hydrochlorothiazide (PRINZIDE,ZESTORETIC) 10-12.5 MG per tablet Take 1 tablet by mouth daily.  30 tablet  6  . meloxicam (MOBIC) 15 MG tablet Take 15 mg by mouth every other day.        . metoprolol (LOPRESSOR) 50 MG tablet Take 50 mg by mouth 2 (two) times daily.        . Misc Natural Products (GLUCOSAMINE CHONDROITIN ADV PO) Take 1 tablet by mouth daily.        . Multiple Vitamins-Minerals (CENTRUM SILVER PO) Take 1 tablet by mouth daily.        . niacin (NIASPAN) 1000 MG CR tablet Take 1,000 mg by mouth daily.        . Omega-3 Fatty Acids (FISH OIL) 1200 MG CAPS Take 1 capsule by mouth daily.        Marland Kitchen omeprazole (PRILOSEC) 40 MG capsule Take 1 capsule (40 mg total) by mouth daily.  30 capsule  1  . ondansetron (ZOFRAN) 8 MG tablet Take 8 mg by mouth 3 (three) times daily.       . simvastatin (ZOCOR) 10 MG tablet take 1 tablet by mouth once daily  30 tablet  6  . vitamin C (ASCORBIC ACID) 500 MG tablet  Take 500 mg by mouth daily.        . vitamin E 400 UNIT capsule Take 400 Units by mouth daily.        . promethazine (PHENERGAN) 12.5 MG tablet Take 1 tablet (12.5 mg total) by mouth every 6 (six) hours as needed for nausea.  60 tablet  0  . sulfamethoxazole-trimethoprim (BACTRIM DS,SEPTRA DS) 800-160 MG per tablet Take 1 tablet by mouth 2 (two) times daily.  20 tablet  0    Review of Systems  Constitutional: Negative for fever, chills, appetite change, fatigue and unexpected weight change.  HENT: Negative for ear pain, congestion, sore throat, trouble swallowing, neck pain, voice change and sinus pressure.   Eyes: Negative for visual disturbance.  Respiratory: Negative for cough, shortness of breath, wheezing and stridor.   Cardiovascular: Negative for chest pain, palpitations and leg swelling.  Gastrointestinal: Positive for nausea and abdominal pain. Negative for vomiting, diarrhea, constipation, blood in stool, abdominal distention and anal bleeding.  Genitourinary: Negative for dysuria and flank pain.  Musculoskeletal: Negative for myalgias, arthralgias and gait problem.  Skin: Positive  for color change and wound. Negative for rash.  Neurological: Negative for dizziness and headaches.  Hematological: Negative for adenopathy. Does not bruise/bleed easily.  Psychiatric/Behavioral: Negative for suicidal ideas, sleep disturbance and dysphoric mood. The patient is not nervous/anxious.    BP 140/60  Pulse 72  Temp(Src) 98.2 F (36.8 C) (Oral)  Ht 5\' 5"  (1.651 m)  Wt 183 lb (83.008 kg)  BMI 30.45 kg/m2  SpO2 96%     Objective:   Physical Exam  Constitutional: She is oriented to person, place, and time. She appears well-developed and well-nourished. No distress.  HENT:  Head: Normocephalic and atraumatic.  Right Ear: External ear normal.  Left Ear: External ear normal.  Nose: Nose normal.  Mouth/Throat: Oropharynx is clear and moist. No oropharyngeal exudate.  Eyes: Conjunctivae  are normal. Pupils are equal, round, and reactive to light. Right eye exhibits no discharge. Left eye exhibits no discharge. No scleral icterus.  Neck: Normal range of motion. Neck supple. No tracheal deviation present. No thyromegaly present.  Cardiovascular: Normal rate, regular rhythm, normal heart sounds and intact distal pulses.  Exam reveals no gallop and no friction rub.   No murmur heard. Pulmonary/Chest: Effort normal and breath sounds normal. No respiratory distress. She has no wheezes. She has no rales. She exhibits no tenderness.  Abdominal: Soft. Bowel sounds are normal. She exhibits no distension and no mass. There is tenderness. There is no rebound and no guarding.    Musculoskeletal: Normal range of motion. She exhibits no edema and no tenderness.  Lymphadenopathy:    She has no cervical adenopathy.  Neurological: She is alert and oriented to person, place, and time. No cranial nerve deficit. She exhibits normal muscle tone. Coordination normal.  Skin: Skin is warm and dry. No rash noted. She is not diaphoretic. No erythema. No pallor.  Psychiatric: She has a normal mood and affect. Her behavior is normal. Judgment and thought content normal.          Assessment & Plan:

## 2011-09-09 ENCOUNTER — Other Ambulatory Visit: Payer: Self-pay | Admitting: Internal Medicine

## 2011-09-12 ENCOUNTER — Encounter: Payer: Self-pay | Admitting: Internal Medicine

## 2011-09-12 ENCOUNTER — Ambulatory Visit (INDEPENDENT_AMBULATORY_CARE_PROVIDER_SITE_OTHER): Payer: Medicare Other | Admitting: Internal Medicine

## 2011-09-12 DIAGNOSIS — E785 Hyperlipidemia, unspecified: Secondary | ICD-10-CM

## 2011-09-12 DIAGNOSIS — I10 Essential (primary) hypertension: Secondary | ICD-10-CM

## 2011-09-12 NOTE — Progress Notes (Signed)
Subjective:    Patient ID: Bailey Howard, female    DOB: 1945-12-12, 66 y.o.   MRN: [id number]  HPI 66 year old female with history of hypertension and recent repair of large abdominal hernia presents for followup. In regards to recent episode of cellulitis and abscess on her abdominal wall at her incision site, she reports that her symptoms completely resolved with the use of antibiotics. She was seen by her plastic surgeon who has now discharged her from further followup. She denies any recurrent pain, erythema, or drainage from her wound. She has not had any recurrent nausea.  In regards to her blood pressure, she reports her blood pressure has been well-controlled at home. She denies any headache, palpitations, or chest pain. She reports full compliance with her medications.  Outpatient Encounter Prescriptions as of 09/12/2011  Medication Sig Dispense Refill  . aspirin 81 MG EC tablet Take 81 mg by mouth daily.        . Cholecalciferol (VITAMIN D3) 1000 UNITS CAPS Take 1 capsule by mouth every 30 (thirty) days.        . Cranberry 650 MG CAPS Take 1 capsule by mouth daily. Dosage?       . ergocalciferol (VITAMIN D2) 50000 UNITS capsule take 1 capsule by mouth MONTLY  3 capsule  0  . hydrocortisone (ANUSOL-HC) 25 MG suppository Place 25 mg rectally 2 (two) times daily.      Marland Kitchen lisinopril-hydrochlorothiazide (PRINZIDE,ZESTORETIC) 10-12.5 MG per tablet Take 1 tablet by mouth daily.  30 tablet  6  . meloxicam (MOBIC) 15 MG tablet Take 15 mg by mouth every other day.        . metoprolol (LOPRESSOR) 50 MG tablet Take 50 mg by mouth 2 (two) times daily.        . Misc Natural Products (GLUCOSAMINE CHONDROITIN ADV PO) Take 1 tablet by mouth daily.        . Multiple Vitamins-Minerals (CENTRUM SILVER PO) Take 1 tablet by mouth daily.        . niacin (NIASPAN) 1000 MG CR tablet Take 1,000 mg by mouth daily.        . Omega-3 Fatty Acids (FISH OIL) 1200 MG CAPS Take 1 capsule by mouth daily.        Marland Kitchen  omeprazole (PRILOSEC) 40 MG capsule Take 1 capsule (40 mg total) by mouth daily.  30 capsule  1  . ondansetron (ZOFRAN) 8 MG tablet Take 8 mg by mouth 3 (three) times daily.       . simvastatin (ZOCOR) 10 MG tablet take 1 tablet by mouth once daily  30 tablet  6  . vitamin C (ASCORBIC ACID) 500 MG tablet Take 500 mg by mouth daily.        . vitamin E 400 UNIT capsule Take 400 Units by mouth daily.         BP 142/62  Pulse 94  Temp(Src) 98.4 F (36.9 C) (Oral)  Ht 5\' 5"  (1.651 m)  Wt 186 lb (84.369 kg)  BMI 30.95 kg/m2  SpO2 95%  Review of Systems  Constitutional: Negative for fever, chills, appetite change, fatigue and unexpected weight change.  HENT: Negative for ear pain, congestion, sore throat, trouble swallowing, neck pain, voice change and sinus pressure.   Eyes: Negative for visual disturbance.  Respiratory: Negative for cough, shortness of breath, wheezing and stridor.   Cardiovascular: Negative for chest pain, palpitations and leg swelling.  Gastrointestinal: Negative for nausea, vomiting, abdominal pain, diarrhea, constipation, blood in stool, abdominal  distention and anal bleeding.  Genitourinary: Negative for dysuria and flank pain.  Musculoskeletal: Negative for myalgias, arthralgias and gait problem.  Skin: Negative for color change and rash.  Neurological: Negative for dizziness and headaches.  Hematological: Negative for adenopathy. Does not bruise/bleed easily.  Psychiatric/Behavioral: Negative for suicidal ideas, sleep disturbance and dysphoric mood. The patient is not nervous/anxious.        Objective:   Physical Exam  Constitutional: She is oriented to person, place, and time. She appears well-developed and well-nourished. No distress.  HENT:  Head: Normocephalic and atraumatic.  Right Ear: External ear normal.  Left Ear: External ear normal.  Nose: Nose normal.  Mouth/Throat: Oropharynx is clear and moist. No oropharyngeal exudate.  Eyes: Conjunctivae are  normal. Pupils are equal, round, and reactive to light. Right eye exhibits no discharge. Left eye exhibits no discharge. No scleral icterus.  Neck: Normal range of motion. Neck supple. No tracheal deviation present. No thyromegaly present.  Cardiovascular: Normal rate, regular rhythm, normal heart sounds and intact distal pulses.  Exam reveals no gallop and no friction rub.   No murmur heard. Pulmonary/Chest: Effort normal and breath sounds normal. No respiratory distress. She has no wheezes. She has no rales. She exhibits no tenderness.  Abdominal:    Musculoskeletal: Normal range of motion. She exhibits no edema and no tenderness.  Lymphadenopathy:    She has no cervical adenopathy.  Neurological: She is alert and oriented to person, place, and time. No cranial nerve deficit. She exhibits normal muscle tone. Coordination normal.  Skin: Skin is warm and dry. No rash noted. She is not diaphoretic. No erythema. No pallor.  Psychiatric: She has a normal mood and affect. Her behavior is normal. Judgment and thought content normal.          Assessment & Plan:

## 2011-09-12 NOTE — Assessment & Plan Note (Addendum)
BP just above goal today.  Better controlled at home. Recent renal function was normal. Will continue current medications. Follow up 3 months.

## 2011-10-01 ENCOUNTER — Ambulatory Visit: Payer: Self-pay | Admitting: Internal Medicine

## 2011-10-02 ENCOUNTER — Ambulatory Visit: Payer: Self-pay | Admitting: Internal Medicine

## 2011-10-02 ENCOUNTER — Telehealth: Payer: Self-pay | Admitting: Internal Medicine

## 2011-10-02 NOTE — Telephone Encounter (Signed)
Dense area noted in left breast.  Need additional views on mammogram.

## 2011-10-02 NOTE — Telephone Encounter (Signed)
[facility] faxed req for additional order. MD signed and will be faxed today

## 2011-10-03 ENCOUNTER — Telehealth: Payer: Self-pay | Admitting: Internal Medicine

## 2011-10-03 ENCOUNTER — Telehealth: Payer: Self-pay | Admitting: *Deleted

## 2011-10-03 DIAGNOSIS — R928 Other abnormal and inconclusive findings on diagnostic imaging of breast: Secondary | ICD-10-CM

## 2011-10-03 NOTE — Telephone Encounter (Signed)
Patient informed of referral and understands that she will be called

## 2011-10-03 NOTE — Telephone Encounter (Signed)
Patient changed her mind instead of Dr. Michela Pitcher she would like Dr. Lemar Livings.

## 2011-10-03 NOTE — Telephone Encounter (Signed)
Left pt vm to call office w/any questions and that referral was in process.

## 2011-10-03 NOTE — Telephone Encounter (Signed)
Additional views on mammogram showed 5mm solid area in the left breast.  The radiologist recommended surgical consultation.

## 2011-10-03 NOTE — Telephone Encounter (Signed)
[phone] Pt called she went to get mammogram, she went back yesterday and had more mammogram and ultra sound.  She is going to have needle biopsy.  Pt would like you to call her.  She needs a referral to a surgeon to have this done. Pt stated she would like to have dr ely. She cannot go Wed 3/27 or 3/29  & April 2

## 2011-10-03 NOTE — Telephone Encounter (Signed)
OK for referral?  

## 2011-10-06 ENCOUNTER — Telehealth: Payer: Self-pay | Admitting: Internal Medicine

## 2011-10-06 NOTE — Telephone Encounter (Signed)
Sorry, it has been sent to be scanned, it is not in our office.

## 2011-10-06 NOTE — Telephone Encounter (Signed)
Spoke with pt. Will set up referral with Dr. Lemar Livings.

## 2011-10-06 NOTE — Telephone Encounter (Signed)
Do you have a copy of the mammogram that has not been sent to be scanned yet.  Byrnett's office will not schedule without the mammogram

## 2011-10-07 ENCOUNTER — Encounter: Payer: Self-pay | Admitting: Internal Medicine

## 2011-10-16 ENCOUNTER — Encounter: Payer: Self-pay | Admitting: Internal Medicine

## 2011-10-16 DIAGNOSIS — I712 Thoracic aortic aneurysm, without rupture: Secondary | ICD-10-CM | POA: Insufficient documentation

## 2011-10-16 DIAGNOSIS — K469 Unspecified abdominal hernia without obstruction or gangrene: Secondary | ICD-10-CM | POA: Insufficient documentation

## 2011-10-16 DIAGNOSIS — E559 Vitamin D deficiency, unspecified: Secondary | ICD-10-CM | POA: Insufficient documentation

## 2011-10-17 ENCOUNTER — Telehealth: Payer: Self-pay | Admitting: Internal Medicine

## 2011-10-17 ENCOUNTER — Encounter: Payer: Self-pay | Admitting: Internal Medicine

## 2011-10-17 NOTE — Telephone Encounter (Signed)
Patient is needing a refill on sulfamethoxazole TMP DS tablets. She was taking one tablet twice daily. This was for her abscess.

## 2011-10-17 NOTE — Telephone Encounter (Signed)
If she has an abscess, she needs to be seen.

## 2011-10-17 NOTE — Telephone Encounter (Signed)
Spoke w/pt, emergent symptoms r/o and pt advised to call and speak w/on call [facility] service w/any change in symptoms or go to ER. She agreed and I scheduled for apt Monday (30 min) for eval

## 2011-10-20 ENCOUNTER — Encounter: Payer: Self-pay | Admitting: Internal Medicine

## 2011-10-20 ENCOUNTER — Ambulatory Visit (INDEPENDENT_AMBULATORY_CARE_PROVIDER_SITE_OTHER): Payer: Medicare Other | Admitting: Internal Medicine

## 2011-10-20 VITALS — BP 160/80 | HR 84 | Temp 98.5°F | Ht 65.0 in | Wt 190.8 lb

## 2011-10-20 DIAGNOSIS — L02219 Cutaneous abscess of trunk, unspecified: Secondary | ICD-10-CM | POA: Insufficient documentation

## 2011-10-20 DIAGNOSIS — N390 Urinary tract infection, site not specified: Secondary | ICD-10-CM

## 2011-10-20 DIAGNOSIS — K649 Unspecified hemorrhoids: Secondary | ICD-10-CM

## 2011-10-20 LAB — POCT URINALYSIS DIPSTICK
Glucose, UA: NEGATIVE
Ketones, UA: NEGATIVE
Spec Grav, UA: 1.015

## 2011-10-20 MED ORDER — SULFAMETHOXAZOLE-TRIMETHOPRIM 800-160 MG PO TABS: 1.0000 | ORAL_TABLET | Freq: Two times a day (BID) | ORAL | Status: AC

## 2011-10-20 NOTE — Assessment & Plan Note (Signed)
Symptoms and urinalysis are consistent with urinary tract infection. Will send urine for culture. Will treat with Bactrim. Patient will followup if symptoms are not improving over the next 24 hours.

## 2011-10-20 NOTE — Assessment & Plan Note (Signed)
Will request records from pt surgeon as to evaluation/management.

## 2011-10-20 NOTE — Patient Instructions (Addendum)
Start antibiotics today for urinary tract infection and skin infection.  Call Dr. Evette Cristal if area on left lower abdomen is not decreasing in size over next 48hr.  We will call you with urine culture results.

## 2011-10-20 NOTE — Progress Notes (Signed)
Subjective:    Patient ID: Bailey Howard, female    DOB: [birth year], 66 y.o.   MRN: [id number]  HPI 66 year old female with history of recent abdominal hernia repair complicated by recurrent abscess in her left lower abdomen presents for followup. She reports that over the weekend, she developed a raised firm area in her left lower abdomen that her surgical site consistent with her earlier abscess. This area has been draining purulent fluid for the last 48 hours. She denies any fever but does report some chills. She also notes some burning with urination and increased urinary frequency. She denies flank pain.  She notes that she is scheduled to see a general surgeon next week to biopsy an area on her rectum which has been bleeding. She is also having colonoscopy at that time. She notes that she initially thought the area was a hemorrhoid, however the surgeon was concerned that it might be some other growth. She reports pain when she strains to have a bowel movement with minimal amount of bright red blood on the toilet paper.  Outpatient Prescriptions Prior to Visit  Medication Sig Dispense Refill  . aspirin 81 MG EC tablet Take 81 mg by mouth daily.        . Cranberry 650 MG CAPS Take 1 capsule by mouth daily. Dosage?       . ergocalciferol (VITAMIN D2) 50000 UNITS capsule take 1 capsule by mouth MONTLY  3 capsule  0  . lisinopril-hydrochlorothiazide (PRINZIDE,ZESTORETIC) 10-12.5 MG per tablet Take 1 tablet by mouth daily.  30 tablet  6  . meloxicam (MOBIC) 15 MG tablet Take 15 mg by mouth every other day.        . metoprolol (LOPRESSOR) 50 MG tablet Take 50 mg by mouth 2 (two) times daily.        . Misc Natural Products (GLUCOSAMINE CHONDROITIN ADV PO) Take 1 tablet by mouth daily.        . Multiple Vitamins-Minerals (CENTRUM SILVER PO) Take 1 tablet by mouth daily.        . niacin (NIASPAN) 1000 MG CR tablet Take 1,000 mg by mouth daily.        . Omega-3 Fatty Acids (FISH OIL) 1200 MG CAPS Take  1 capsule by mouth daily.        . simvastatin (ZOCOR) 10 MG tablet take 1 tablet by mouth once daily  30 tablet  6  . vitamin C (ASCORBIC ACID) 500 MG tablet Take 500 mg by mouth daily.        . vitamin E 400 UNIT capsule Take 400 Units by mouth daily.        . Cholecalciferol (VITAMIN D3) 1000 UNITS CAPS Take 1 capsule by mouth every 30 (thirty) days.        . hydrocortisone (ANUSOL-HC) 25 MG suppository Place 25 mg rectally 2 (two) times daily.      Marland Kitchen omeprazole (PRILOSEC) 40 MG capsule Take 1 capsule (40 mg total) by mouth daily.  30 capsule  1  . ondansetron (ZOFRAN) 8 MG tablet Take 8 mg by mouth 3 (three) times daily.         Review of Systems  Constitutional: Negative for fever, chills, appetite change, fatigue and unexpected weight change.  HENT: Negative for ear pain, congestion, sore throat, trouble swallowing, neck pain, voice change and sinus pressure.   Eyes: Negative for visual disturbance.  Respiratory: Negative for cough, shortness of breath, wheezing and stridor.   Cardiovascular: Negative for chest  pain, palpitations and leg swelling.  Gastrointestinal: Positive for anal bleeding and rectal pain. Negative for nausea, vomiting, abdominal pain, diarrhea, constipation, blood in stool and abdominal distention.  Genitourinary: Positive for dysuria, urgency and frequency. Negative for flank pain.  Musculoskeletal: Negative for myalgias, arthralgias and gait problem.  Skin: Positive for color change and wound. Negative for rash.  Neurological: Negative for dizziness and headaches.  Hematological: Negative for adenopathy. Does not bruise/bleed easily.  Psychiatric/Behavioral: Negative for suicidal ideas, sleep disturbance and dysphoric mood. The patient is not nervous/anxious.    BP 160/80  Pulse 84  Temp(Src) 98.5 F (36.9 C) (Oral)  Ht 5\' 5"  (1.651 m)  Wt 190 lb 12 oz (86.524 kg)  BMI 31.74 kg/m2  SpO2 96%     Objective:   Physical Exam  Constitutional: She is  oriented to person, place, and time. She appears well-developed and well-nourished. No distress.  HENT:  Head: Normocephalic and atraumatic.  Right Ear: External ear normal.  Left Ear: External ear normal.  Nose: Nose normal.  Mouth/Throat: Oropharynx is clear and moist. No oropharyngeal exudate.  Eyes: Conjunctivae are normal. Pupils are equal, round, and reactive to light. Right eye exhibits no discharge. Left eye exhibits no discharge. No scleral icterus.  Neck: Normal range of motion. Neck supple. No tracheal deviation present. No thyromegaly present.  Cardiovascular: Normal rate, regular rhythm, normal heart sounds and intact distal pulses.  Exam reveals no gallop and no friction rub.   No murmur heard. Pulmonary/Chest: Effort normal and breath sounds normal. No respiratory distress. She has no wheezes. She has no rales. She exhibits no tenderness.  Abdominal: Soft. She exhibits no distension. There is tenderness.    Musculoskeletal: Normal range of motion. She exhibits no edema and no tenderness.  Lymphadenopathy:    She has no cervical adenopathy.  Neurological: She is alert and oriented to person, place, and time. No cranial nerve deficit. She exhibits normal muscle tone. Coordination normal.  Skin: Skin is warm and dry. No rash noted. She is not diaphoretic. No erythema. No pallor.  Psychiatric: She has a normal mood and affect. Her behavior is normal. Judgment and thought content normal.          Assessment & Plan:

## 2011-10-20 NOTE — Assessment & Plan Note (Signed)
Patient has recurrent abscess in her left lower abdomen at her surgical site. The abscess is currently draining, however a question if it may be loculated given persistence. Will start her back on Bactrim. If induration doesn't completely resolve over the next 48 hours, she will set up followup with her general surgeon for incision and drainage.

## 2011-10-22 LAB — URINE CULTURE: Colony Count: >=100000

## 2011-11-07 ENCOUNTER — Ambulatory Visit: Payer: Self-pay | Admitting: General Surgery

## 2011-11-10 LAB — PATHOLOGY REPORT

## 2011-11-12 ENCOUNTER — Telehealth: Payer: Self-pay | Admitting: *Deleted

## 2011-11-12 NOTE — Telephone Encounter (Signed)
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 Pt's niece is now going to able to attend Tx conference with her tomorrow/SLS

## 2011-11-12 NOTE — Telephone Encounter (Signed)
Was she diagnosed with anal cancer? She was schedule to have biopsy, but I have not received any reports. Can we request reports?

## 2011-11-12 NOTE — Telephone Encounter (Signed)
Patient just wanted to inform you that she would be at conference [facility] cancer Dr. Talbot Grumbling tomorrow from 12-12:30p at [facility] [address] [down the hall from cancer center]/SLS

## 2011-11-17 ENCOUNTER — Ambulatory Visit: Payer: Self-pay | Admitting: General Surgery

## 2011-11-27 ENCOUNTER — Telehealth: Payer: Self-pay | Admitting: Internal Medicine

## 2011-11-27 ENCOUNTER — Ambulatory Visit: Payer: Self-pay | Admitting: Oncology

## 2011-11-27 NOTE — Telephone Encounter (Signed)
We will be happy to continue with her refills during her treatment

## 2011-11-27 NOTE — Telephone Encounter (Signed)
[phone] Pt called dr Evette Cristal is going to surgery 5/23 for ports.  She will start her cancer treatment after this.  Dr Greggory Brandy [facility] cancer center told [name] that she would come to him for everything after the treatment starts.   The treatment will last 7 to 8 weeks.  After this she will start back seeing dr walker.  Pt is concerned about getting her rx if she needs refills

## 2011-11-28 NOTE — Telephone Encounter (Signed)
Patient informed/SLS  

## 2011-12-01 ENCOUNTER — Telehealth: Payer: Self-pay

## 2011-12-01 NOTE — Telephone Encounter (Signed)
Sherry at [facility] requested last Cmet and CBC with Diff faxed to [phone]. Last lab faxed as instructed.

## 2011-12-02 ENCOUNTER — Ambulatory Visit: Payer: Self-pay | Admitting: Anesthesiology

## 2011-12-02 LAB — POTASSIUM: Potassium: 5.9 mmol/L — ABNORMAL HIGH (ref 3.5–5.1)

## 2011-12-02 LAB — HEMOGLOBIN: HGB: 12.3 g/dL (ref 12.0–16.0)

## 2011-12-03 LAB — COMPREHENSIVE METABOLIC PANEL
Alkaline Phosphatase: 89 U/L (ref 50–136)
BUN: 22 mg/dL — ABNORMAL HIGH (ref 7–18)
EGFR (African American): 51 — ABNORMAL LOW
EGFR (Non-African Amer.): 44 — ABNORMAL LOW
Glucose: 90 mg/dL (ref 65–99)
Osmolality: 277 (ref 275–301)
SGOT(AST): 28 U/L (ref 15–37)
SGPT (ALT): 27 U/L
Sodium: 137 mmol/L (ref 136–145)
Total Protein: 7.4 g/dL (ref 6.4–8.2)

## 2011-12-03 LAB — CBC CANCER CENTER
Basophil %: 0.6 %
Eosinophil #: 0.1 x10 3/mm (ref 0.0–0.7)
HCT: 38.1 % (ref 35.0–47.0)
HGB: 12.7 g/dL (ref 12.0–16.0)
MCH: 29.5 pg (ref 26.0–34.0)
MCHC: 33.4 g/dL (ref 32.0–36.0)
Neutrophil #: 5.4 x10 3/mm (ref 1.4–6.5)
Neutrophil %: 68.5 %
RBC: 4.31 10*6/uL (ref 3.80–5.20)

## 2011-12-04 ENCOUNTER — Ambulatory Visit: Payer: Self-pay | Admitting: General Surgery

## 2011-12-05 LAB — PATHOLOGY REPORT

## 2011-12-10 ENCOUNTER — Other Ambulatory Visit: Payer: Medicare Other

## 2011-12-12 ENCOUNTER — Telehealth: Payer: Self-pay | Admitting: Internal Medicine

## 2011-12-12 ENCOUNTER — Other Ambulatory Visit: Payer: Self-pay | Admitting: Internal Medicine

## 2011-12-12 NOTE — Telephone Encounter (Signed)
Error

## 2011-12-12 NOTE — Telephone Encounter (Signed)
Needs to have repeat Vit D level

## 2011-12-13 ENCOUNTER — Ambulatory Visit: Payer: Self-pay | Admitting: Oncology

## 2011-12-15 ENCOUNTER — Ambulatory Visit: Payer: Medicare Other | Admitting: Internal Medicine

## 2011-12-16 ENCOUNTER — Other Ambulatory Visit: Payer: Self-pay | Admitting: Internal Medicine

## 2011-12-16 ENCOUNTER — Other Ambulatory Visit: Payer: Medicare Other | Admitting: *Deleted

## 2011-12-16 DIAGNOSIS — E559 Vitamin D deficiency, unspecified: Secondary | ICD-10-CM

## 2011-12-16 MED ORDER — METOPROLOL TARTRATE 50 MG PO TABS: 50.0000 mg | ORAL_TABLET | Freq: Two times a day (BID) | ORAL | Status: DC

## 2011-12-17 LAB — VITAMIN D 25 HYDROXY (VIT D DEFICIENCY, FRACTURES): Vit D, 25-Hydroxy: 33 ng/mL (ref 30–89)

## 2011-12-23 LAB — COMPREHENSIVE METABOLIC PANEL
Albumin: 3.7 g/dL (ref 3.4–5.0)
Anion Gap: 7 (ref 7–16)
BUN: 17 mg/dL (ref 7–18)
Chloride: 107 mmol/L (ref 98–107)
Creatinine: 0.77 mg/dL (ref 0.60–1.30)
EGFR (African American): >60
Osmolality: 279 (ref 275–301)
Potassium: 4.1 mmol/L (ref 3.5–5.1)
SGOT(AST): 19 U/L (ref 15–37)
Total Protein: 6.7 g/dL (ref 6.4–8.2)

## 2011-12-23 LAB — CBC CANCER CENTER
HCT: 37.9 % (ref 35.0–47.0)
HGB: 12.7 g/dL (ref 12.0–16.0)
MCH: 30.3 pg (ref 26.0–34.0)
MCHC: 33.7 g/dL (ref 32.0–36.0)
MCV: 90 fL (ref 80–100)
Monocyte #: 0.6 x10 3/mm (ref 0.2–0.9)
Neutrophil #: 4.7 x10 3/mm (ref 1.4–6.5)
Neutrophil %: 65.5 %
Platelet: 274 x10 3/mm (ref 150–440)
RBC: 4.21 10*6/uL (ref 3.80–5.20)
RDW: 16.3 % — ABNORMAL HIGH (ref 11.5–14.5)

## 2011-12-30 LAB — COMPREHENSIVE METABOLIC PANEL
Albumin: 3.6 g/dL (ref 3.4–5.0)
Anion Gap: 12 (ref 7–16)
Bilirubin,Total: 0.8 mg/dL (ref 0.2–1.0)
Co2: 21 mmol/L (ref 21–32)
Creatinine: 1.46 mg/dL — ABNORMAL HIGH (ref 0.60–1.30)
EGFR (African American): 42 — ABNORMAL LOW
EGFR (Non-African Amer.): 37 — ABNORMAL LOW
Glucose: 106 mg/dL — ABNORMAL HIGH (ref 65–99)
Osmolality: 282 (ref 275–301)
Potassium: 3.8 mmol/L (ref 3.5–5.1)
SGOT(AST): 22 U/L (ref 15–37)
SGPT (ALT): 21 U/L
Sodium: 137 mmol/L (ref 136–145)
Total Protein: 6.7 g/dL (ref 6.4–8.2)

## 2011-12-30 LAB — CBC CANCER CENTER
Basophil #: 0 x10 3/mm (ref 0.0–0.1)
Eosinophil %: 1.6 %
HCT: 36.9 % (ref 35.0–47.0)
HGB: 12.3 g/dL (ref 12.0–16.0)
Lymphocyte %: 15.6 %
MCHC: 33.3 g/dL (ref 32.0–36.0)
Monocyte #: 0.1 x10 3/mm — ABNORMAL LOW (ref 0.2–0.9)
Monocyte %: 1.9 %
Neutrophil #: 3.5 x10 3/mm (ref 1.4–6.5)
Neutrophil %: 80.2 %
RBC: 4.11 10*6/uL (ref 3.80–5.20)
RDW: 15.4 % — ABNORMAL HIGH (ref 11.5–14.5)
WBC: 4.3 x10 3/mm (ref 3.6–11.0)

## 2012-01-06 LAB — CBC CANCER CENTER
Basophil #: 0 x10 3/mm (ref 0.0–0.1)
Basophil %: 0.2 %
Eosinophil #: 0.1 x10 3/mm (ref 0.0–0.7)
Eosinophil %: 2.6 %
HCT: 33.1 % — ABNORMAL LOW (ref 35.0–47.0)
Lymphocyte #: 0.3 x10 3/mm — ABNORMAL LOW (ref 1.0–3.6)
Lymphocyte %: 15.1 %
MCHC: 34.1 g/dL (ref 32.0–36.0)
Monocyte #: 0.3 x10 3/mm (ref 0.2–0.9)
Monocyte %: 15.7 %
Neutrophil #: 1.4 x10 3/mm (ref 1.4–6.5)
Neutrophil %: 66.4 %
Platelet: 105 x10 3/mm — ABNORMAL LOW (ref 150–440)
RDW: 15.1 % — ABNORMAL HIGH (ref 11.5–14.5)
WBC: 2 x10 3/mm — CL (ref 3.6–11.0)

## 2012-01-12 ENCOUNTER — Ambulatory Visit: Payer: Self-pay | Admitting: Oncology

## 2012-01-13 LAB — CBC CANCER CENTER
Basophil %: 0.4 %
Eosinophil #: 0 x10 3/mm (ref 0.0–0.7)
Eosinophil %: 0.6 %
HGB: 11.1 g/dL — ABNORMAL LOW (ref 12.0–16.0)
Lymphocyte #: 0.3 x10 3/mm — ABNORMAL LOW (ref 1.0–3.6)
MCH: 30.4 pg (ref 26.0–34.0)
MCV: 90 fL (ref 80–100)
Monocyte #: 0.5 x10 3/mm (ref 0.2–0.9)
Monocyte %: 9.4 %
Neutrophil %: 84 %
Platelet: 167 x10 3/mm (ref 150–440)
WBC: 5.3 x10 3/mm (ref 3.6–11.0)

## 2012-01-16 ENCOUNTER — Other Ambulatory Visit: Payer: Self-pay | Admitting: Internal Medicine

## 2012-01-20 LAB — COMPREHENSIVE METABOLIC PANEL
Anion Gap: 9 (ref 7–16)
BUN: 13 mg/dL (ref 7–18)
Bilirubin,Total: 0.7 mg/dL (ref 0.2–1.0)
Co2: 26 mmol/L (ref 21–32)
Creatinine: 0.96 mg/dL (ref 0.60–1.30)
EGFR (African American): >60
Glucose: 120 mg/dL — ABNORMAL HIGH (ref 65–99)
Osmolality: 284 (ref 275–301)
Potassium: 3.3 mmol/L — ABNORMAL LOW (ref 3.5–5.1)
Total Protein: 6.3 g/dL — ABNORMAL LOW (ref 6.4–8.2)

## 2012-01-20 LAB — CBC CANCER CENTER
Basophil #: 0 x10 3/mm (ref 0.0–0.1)
Eosinophil %: 2.5 %
HCT: 30.6 % — ABNORMAL LOW (ref 35.0–47.0)
Lymphocyte #: 0.2 x10 3/mm — ABNORMAL LOW (ref 1.0–3.6)
MCH: 31.3 pg (ref 26.0–34.0)
MCV: 92 fL (ref 80–100)
Monocyte #: 0.4 x10 3/mm (ref 0.2–0.9)
Monocyte %: 8.2 %
Neutrophil #: 4.4 x10 3/mm (ref 1.4–6.5)
Platelet: 232 x10 3/mm (ref 150–440)
RDW: 15.9 % — ABNORMAL HIGH (ref 11.5–14.5)
WBC: 5.2 x10 3/mm (ref 3.6–11.0)

## 2012-01-27 LAB — CBC CANCER CENTER
Basophil #: 0 x10 3/mm (ref 0.0–0.1)
Basophil %: 0.8 %
Eosinophil %: 3.9 %
HCT: 30.6 % — ABNORMAL LOW (ref 35.0–47.0)
Lymphocyte %: 4.2 %
MCHC: 33.3 g/dL (ref 32.0–36.0)
MCV: 92 fL (ref 80–100)
Neutrophil %: 88.7 %
Platelet: 181 x10 3/mm (ref 150–440)
RDW: 16.4 % — ABNORMAL HIGH (ref 11.5–14.5)

## 2012-02-02 LAB — URINALYSIS, COMPLETE
Bilirubin,UR: NEGATIVE
Blood: NEGATIVE
Glucose,UR: NEGATIVE mg/dL (ref 0–75)
Nitrite: NEGATIVE
Ph: 6 (ref 4.5–8.0)
Protein: 100
Specific Gravity: 1.011 (ref 1.003–1.030)
Squamous Epithelial: 2
WBC UR: 413 /HPF (ref 0–5)

## 2012-02-04 LAB — URINE CULTURE

## 2012-02-12 ENCOUNTER — Ambulatory Visit: Payer: Self-pay | Admitting: Oncology

## 2012-02-16 ENCOUNTER — Other Ambulatory Visit: Payer: Self-pay | Admitting: Internal Medicine

## 2012-02-20 ENCOUNTER — Telehealth: Payer: Self-pay | Admitting: Internal Medicine

## 2012-02-20 NOTE — Telephone Encounter (Signed)
Patient advised as instructed via telephone, she has an appt on 02/23/2012 to see Dr. Dan Humphreys.

## 2012-02-20 NOTE — Telephone Encounter (Signed)
I think the best option would be for her to have labs including CMP and lipids prior to restarting medication. We should also see her in follow up.

## 2012-02-20 NOTE — Telephone Encounter (Signed)
Patient has finished treatment for cancer. She is going to run out of her cholesterol medication Sunday night she wanted to know if she needs to come in for blood work before she can get her refill. I did go ahead and put her on Monday's schedule but she would like to know if she needs to come in for that appointment or does she need lab work first and if so does she need to fast for that.

## 2012-02-23 ENCOUNTER — Other Ambulatory Visit: Payer: Self-pay | Admitting: Internal Medicine

## 2012-02-23 ENCOUNTER — Ambulatory Visit (INDEPENDENT_AMBULATORY_CARE_PROVIDER_SITE_OTHER): Payer: Medicare Other | Admitting: Internal Medicine

## 2012-02-23 ENCOUNTER — Encounter: Payer: Self-pay | Admitting: Internal Medicine

## 2012-02-23 VITALS — BP 140/72 | HR 80 | Temp 97.9°F | Ht 65.0 in | Wt 178.2 lb

## 2012-02-23 DIAGNOSIS — E559 Vitamin D deficiency, unspecified: Secondary | ICD-10-CM

## 2012-02-23 DIAGNOSIS — E785 Hyperlipidemia, unspecified: Secondary | ICD-10-CM | POA: Insufficient documentation

## 2012-02-23 DIAGNOSIS — R300 Dysuria: Secondary | ICD-10-CM

## 2012-02-23 DIAGNOSIS — C210 Malignant neoplasm of anus, unspecified: Secondary | ICD-10-CM

## 2012-02-23 LAB — COMPREHENSIVE METABOLIC PANEL
AST: 21 U/L (ref 0–37)
BUN: 9 mg/dL (ref 6–23)
Calcium: 9 mg/dL (ref 8.4–10.5)
Chloride: 109 mEq/L (ref 96–112)
Creatinine, Ser: 0.8 mg/dL (ref 0.4–1.2)

## 2012-02-23 LAB — LIPID PANEL
HDL: 45.2 mg/dL (ref >39.00)
Triglycerides: 213 mg/dL — ABNORMAL HIGH (ref 0.0–149.0)

## 2012-02-23 LAB — POCT URINALYSIS DIPSTICK
Blood, UA: NEGATIVE
Nitrite, UA: NEGATIVE
Protein, UA: 30
Urobilinogen, UA: 0.2
pH, UA: 5

## 2012-02-23 LAB — LDL CHOLESTEROL, DIRECT: Direct LDL: 48.5 mg/dL

## 2012-02-23 MED ORDER — SIMVASTATIN 10 MG PO TABS: 10.0000 mg | ORAL_TABLET | Freq: Every day | ORAL | Status: DC

## 2012-02-23 MED ORDER — SULFAMETHOXAZOLE-TRIMETHOPRIM 400-80 MG PO TABS: 1.0000 | ORAL_TABLET | Freq: Two times a day (BID) | ORAL | Status: AC

## 2012-02-23 NOTE — Assessment & Plan Note (Signed)
Doing well. Completed chemo and xrt. Has PET scan scheduled for 03/2012.  Will continue to monitor.

## 2012-02-23 NOTE — Assessment & Plan Note (Signed)
Symptoms and urinalysis consistent with urinary tract infection. Will treat with Bactrim. Will send urine for culture. Patient will call if symptoms are not improving.

## 2012-02-23 NOTE — Progress Notes (Signed)
Subjective:    Patient ID: Bailey Howard, female    DOB: 1946-05-22, 66 y.o.   MRN: [id number]  HPI 66 year old female with history of anal carcinoma, hyperlipidemia and hypertension presents for followup. In the interim since her last visit, she was diagnosed with anal carcinoma and underwent treatment including chemotherapy and radiation therapy. She reports significant diarrhea with these interventions. However, she reports she is recovered well and is generally doing well at this point. She has followup with oncology and scheduled PET scan for next month.  In regards to hyperlipidemia, she continues on simvastatin. She denies any noted side effects from this medication. In regards to hypertension, she reports she continues on lisinopril hydrochlorothiazide. Blood pressures have been well controlled. She denies any side effects from the medication.  She is concerned today about a several day history of burning with urination. She denies any hematuria, flank pain, fever, chills. She does have some increased urinary frequency.  Outpatient Encounter Prescriptions as of 02/23/2012  Medication Sig Dispense Refill  . aspirin 81 MG EC tablet Take 81 mg by mouth daily.        . Cholecalciferol (VITAMIN D3) 1000 UNITS CAPS Take 1 capsule by mouth every 30 (thirty) days.        . Cranberry 650 MG CAPS Take 1 capsule by mouth daily. Dosage?       . hydrocortisone (ANUSOL-HC) 25 MG suppository Place 25 mg rectally 2 (two) times daily.      Marland Kitchen lisinopril-hydrochlorothiazide (PRINZIDE,ZESTORETIC) 10-12.5 MG per tablet take 1 tablet by mouth once daily  30 tablet  6  . meloxicam (MOBIC) 15 MG tablet take 1 tablet by mouth once daily if needed  90 tablet  3  . metoprolol (LOPRESSOR) 50 MG tablet Take 1 tablet (50 mg total) by mouth 2 (two) times daily.  120 tablet  6  . Misc Natural Products (GLUCOSAMINE CHONDROITIN ADV PO) Take 1 tablet by mouth daily.        . Multiple Vitamins-Minerals (CENTRUM SILVER PO)  Take 1 tablet by mouth daily.        . niacin (NIASPAN) 1000 MG CR tablet Take 1,000 mg by mouth daily.        . Omega-3 Fatty Acids (FISH OIL) 1200 MG CAPS Take 1 capsule by mouth daily.        Marland Kitchen omeprazole (PRILOSEC) 40 MG capsule Take 1 capsule (40 mg total) by mouth daily.  30 capsule  1  . ondansetron (ZOFRAN) 8 MG tablet Take 8 mg by mouth 3 (three) times daily.       . simvastatin (ZOCOR) 10 MG tablet Take 1 tablet (10 mg total) by mouth at bedtime.  90 tablet  3  . vitamin C (ASCORBIC ACID) 500 MG tablet Take 500 mg by mouth daily.        . Vitamin D, Ergocalciferol, (DRISDOL) 50000 UNITS CAPS take 1 capsule by mouth MONTLY  3 capsule  0  . vitamin E 400 UNIT capsule Take 400 Units by mouth daily.        Marland Kitchen DISCONTD: simvastatin (ZOCOR) 10 MG tablet take 1 tablet by mouth once daily  30 tablet  6  . sulfamethoxazole-trimethoprim (BACTRIM,SEPTRA) 400-80 MG per tablet Take 1 tablet by mouth 2 (two) times daily.  14 tablet  0    Review of Systems  Constitutional: Negative for fever, chills, appetite change, fatigue and unexpected weight change.  HENT: Negative for ear pain, congestion, sore throat, trouble swallowing, neck pain,  voice change and sinus pressure.   Eyes: Negative for visual disturbance.  Respiratory: Negative for cough, shortness of breath, wheezing and stridor.   Cardiovascular: Negative for chest pain, palpitations and leg swelling.  Gastrointestinal: Negative for nausea, vomiting, abdominal pain, diarrhea, constipation, blood in stool, abdominal distention and anal bleeding.  Genitourinary: Negative for dysuria and flank pain.  Musculoskeletal: Negative for myalgias, arthralgias and gait problem.  Skin: Negative for color change and rash.  Neurological: Negative for dizziness and headaches.  Hematological: Negative for adenopathy. Does not bruise/bleed easily.  Psychiatric/Behavioral: Negative for suicidal ideas, disturbed wake/sleep cycle and dysphoric mood. The  patient is not nervous/anxious.        Objective:   Physical Exam  Constitutional: She is oriented to person, place, and time. She appears well-developed and well-nourished. No distress.  HENT:  Head: Normocephalic and atraumatic.  Right Ear: External ear normal.  Left Ear: External ear normal.  Nose: Nose normal.  Mouth/Throat: Oropharynx is clear and moist. No oropharyngeal exudate.  Eyes: Conjunctivae are normal. Pupils are equal, round, and reactive to light. Right eye exhibits no discharge. Left eye exhibits no discharge. No scleral icterus.  Neck: Normal range of motion. Neck supple. No tracheal deviation present. No thyromegaly present.  Cardiovascular: Normal rate, regular rhythm, normal heart sounds and intact distal pulses.  Exam reveals no gallop and no friction rub.   No murmur heard. Pulmonary/Chest: Effort normal and breath sounds normal. No respiratory distress. She has no wheezes. She has no rales. She exhibits no tenderness.  Musculoskeletal: Normal range of motion. She exhibits no edema and no tenderness.  Lymphadenopathy:    She has no cervical adenopathy.  Neurological: She is alert and oriented to person, place, and time. No cranial nerve deficit. She exhibits normal muscle tone. Coordination normal.  Skin: Skin is warm and dry. No rash noted. She is not diaphoretic. No erythema. No pallor.  Psychiatric: She has a normal mood and affect. Her behavior is normal. Judgment and thought content normal.          Assessment & Plan:

## 2012-02-23 NOTE — Assessment & Plan Note (Signed)
Will check Vit D level with labs today. 

## 2012-02-23 NOTE — Assessment & Plan Note (Signed)
Will check lipids and LFTs with labs today. Follow up 3 months.

## 2012-02-24 NOTE — Addendum Note (Signed)
Addended by: Jobie Quaker on: 02/24/2012 04:17 PM   Modules accepted: Orders

## 2012-02-26 ENCOUNTER — Other Ambulatory Visit: Payer: Medicare Other | Admitting: *Deleted

## 2012-02-26 DIAGNOSIS — N390 Urinary tract infection, site not specified: Secondary | ICD-10-CM

## 2012-02-26 LAB — URINE CULTURE

## 2012-02-29 LAB — URINE CULTURE

## 2012-03-01 ENCOUNTER — Other Ambulatory Visit: Payer: Self-pay | Admitting: *Deleted

## 2012-03-01 MED ORDER — NITROFURANTOIN MONOHYD MACRO 100 MG PO CAPS: 100.0000 mg | ORAL_CAPSULE | Freq: Two times a day (BID) | ORAL | Status: DC

## 2012-03-14 ENCOUNTER — Ambulatory Visit: Payer: Self-pay | Admitting: Oncology

## 2012-03-16 ENCOUNTER — Ambulatory Visit: Payer: Self-pay | Admitting: Oncology

## 2012-03-23 LAB — CBC CANCER CENTER
Basophil #: 0 x10 3/mm (ref 0.0–0.1)
Basophil %: 0.7 %
Eosinophil %: 4.3 %
HCT: 31.9 % — ABNORMAL LOW (ref 35.0–47.0)
HGB: 11.1 g/dL — ABNORMAL LOW (ref 12.0–16.0)
Lymphocyte %: 6.7 %
MCH: 33.9 pg (ref 26.0–34.0)
MCHC: 34.6 g/dL (ref 32.0–36.0)
Monocyte %: 10.3 %
Neutrophil %: 78 %
Platelet: 254 x10 3/mm (ref 150–440)

## 2012-03-23 LAB — COMPREHENSIVE METABOLIC PANEL
Albumin: 3.2 g/dL — ABNORMAL LOW (ref 3.4–5.0)
Alkaline Phosphatase: 67 U/L (ref 50–136)
BUN: 17 mg/dL (ref 7–18)
Bilirubin,Total: 0.9 mg/dL (ref 0.2–1.0)
Chloride: 111 mmol/L — ABNORMAL HIGH (ref 98–107)
Co2: 23 mmol/L (ref 21–32)
Creatinine: 0.69 mg/dL (ref 0.60–1.30)
EGFR (African American): >60
EGFR (Non-African Amer.): >60
Potassium: 3.6 mmol/L (ref 3.5–5.1)
SGPT (ALT): 18 U/L (ref 12–78)
Sodium: 144 mmol/L (ref 136–145)

## 2012-03-23 LAB — URINALYSIS, COMPLETE
Bilirubin,UR: NEGATIVE
Blood: NEGATIVE
Nitrite: NEGATIVE
Protein: 30
Specific Gravity: 1.017 (ref 1.003–1.030)
WBC UR: 145 /HPF (ref 0–5)

## 2012-04-13 ENCOUNTER — Ambulatory Visit: Payer: Self-pay | Admitting: Oncology

## 2012-04-27 ENCOUNTER — Encounter: Payer: Self-pay | Admitting: Internal Medicine

## 2012-04-27 ENCOUNTER — Ambulatory Visit (INDEPENDENT_AMBULATORY_CARE_PROVIDER_SITE_OTHER): Payer: Medicare Other | Admitting: Internal Medicine

## 2012-04-27 ENCOUNTER — Telehealth: Payer: Self-pay | Admitting: Internal Medicine

## 2012-04-27 VITALS — BP 172/84 | HR 90 | Temp 98.5°F | Ht 65.0 in | Wt 181.2 lb

## 2012-04-27 DIAGNOSIS — N390 Urinary tract infection, site not specified: Secondary | ICD-10-CM

## 2012-04-27 DIAGNOSIS — I10 Essential (primary) hypertension: Secondary | ICD-10-CM

## 2012-04-27 DIAGNOSIS — M549 Dorsalgia, unspecified: Secondary | ICD-10-CM

## 2012-04-27 DIAGNOSIS — R300 Dysuria: Secondary | ICD-10-CM

## 2012-04-27 LAB — POCT URINALYSIS DIPSTICK
Bilirubin, UA: NEGATIVE
Glucose, UA: NEGATIVE
Ketones, UA: NEGATIVE
Nitrite, UA: NEGATIVE
Protein, UA: NEGATIVE
Spec Grav, UA: <=1.005
Urobilinogen, UA: 0.2
pH, UA: 5.5

## 2012-04-27 MED ORDER — AMOXICILLIN 875 MG PO TABS: 875.0000 mg | ORAL_TABLET | Freq: Two times a day (BID) | ORAL | Status: DC

## 2012-04-27 NOTE — Patient Instructions (Signed)
It was nice meeting you today.  I am sorry you are not feeling well.  I am going to prescribe an antibiotic (Amoxicillin) to take 2x/day.  Let us know if the symptoms worsen or do not resolve.

## 2012-04-27 NOTE — Telephone Encounter (Signed)
Please see if Dr. Lorin Picket can see her in urgent visit this afternoon.

## 2012-04-27 NOTE — Telephone Encounter (Signed)
Pt says she has a UTI and Dr. Dan Humphreys is booked up. Robin sent her to [facility]. Pt says she couldn't get through on [facility] so I told her I would take a message this way.

## 2012-04-27 NOTE — Telephone Encounter (Signed)
See if she can come in today at 3:00 - work in for this problem.

## 2012-04-27 NOTE — Telephone Encounter (Signed)
Dr. Dan Humphreys was wondering if you would be able to see this pt this after noon for a sick visit for a UTI ???

## 2012-04-28 ENCOUNTER — Encounter: Payer: Self-pay | Admitting: Internal Medicine

## 2012-04-28 DIAGNOSIS — N390 Urinary tract infection, site not specified: Secondary | ICD-10-CM | POA: Insufficient documentation

## 2012-04-28 NOTE — Addendum Note (Signed)
Addended by: Mauri Reading on: 04/28/2012 08:09 AM   Modules accepted: Orders

## 2012-04-28 NOTE — Assessment & Plan Note (Signed)
Urinalysis revealed small leukocytes with trace blood. In reviewing the records, the most recent urine culture grew out enterococcus. Even though she felt better for a while, she states she feels her symptoms never completely resolved from the last urinary tract infection.  Given this information and given the fact that she grew out Enterococcus, I am going to place her on amoxicillin 875 mg.  She was instructed to take one tablet twice a day. She was also instructed to stay hydrated. Informed her that if her symptoms worsened or did not completely resolve she did need to be reevaluated.  She does see Dr. Achilles Dunk. Apparently has had 3 bladder surgeries in the past. I discussed with her today that if she continued to have recurrent infections - she would need to followup with him.  We will await urine culture to confirm that she is on the appropriate antibiotic. She was very comfortable with this plan.

## 2012-04-28 NOTE — Progress Notes (Signed)
  Subjective:    Patient ID: Bailey Howard, female    DOB: 08-24-1945, 66 y.o.   MRN: [id number]  HPI 66 year old female with past history of anal carcinoma and hypertension. She comes in today as a work in with concerns regarding a possible urinary tract infection. She has just recently undergone chemotherapy and radiation therapy for anal carcinoma. She was also recently evaluated and treated for a persistent urinary tract infection. Apparently took antibiotics for 2-3 weeks for that infection. She states that these symptoms she is having now are very similar to her previous urinary tract infection symptoms. She reports having low back pain and dysuria. She also reports lower abdominal pressure but no other abdominal pain. No hematuria. No vomiting. She is drinking plenty of fluids - (staying hydrated). She has noticed some decreased appetite over the last one to two weeks.  No vaginal symptoms.  Past Medical History  Diagnosis Date  . HLD (hyperlipidemia)   . Allergic rhinitis   . HTN (hypertension)   . Osteoarthritis     L wrist   . Insomnia   . Hernia 7-10    abdominal wall w/ incarcerated bowel, repaired by Dr Wyn Quaker, had wound vac X 4 months and skin graft  . Thoracic aortic aneurysm 2011    by ECHO, followed by Dr Mariah Milling  . Anemia     NOS  . Vitamin D deficiency 08-21-10    Review of Systems Patient denies any headache, lightheadedness or dizziness.  No chest pain, tightness or palpatations. No increased shortness of breath, cough or congestion.  No nausea or vomiting, but does report some decreased appetite.  No significant abdominal pain or cramping, but she does report some lower abdominal pressure and fullness. Again, she states that this feels very similar to her previous symptoms she had with her last urinary tract infection.  No bowel change, such as diarrhea, constipation, BRBPR or melana.  Previous diarrhea that she was experiencing has resolved.  She does have arthritis in her  back. States this has been flaring up more recently.      Objective:   Physical Exam Filed Vitals:   04/27/12 1515  BP: 172/84  Pulse: 90  Temp: 98.5 F (36.9 C)   Blood pressure recheck: 166/82  66year old female in no acute distress.   NECK:  Supple, nontender. HEART:  Appears to be regular. LUNGS:  Without crackles or wheezing audible.  Respirations even and unlabored.  ABDOMEN:  Soft.  No significant tenderness to palpation. Very minimal pressure in the lower abdomen over the suprapubic region.  No audible abdominal bruit.            BACK:  Nontender. No CVA tenderness.        Assessment & Plan:

## 2012-04-28 NOTE — Assessment & Plan Note (Signed)
Blood pressure is elevated today. See above. She is having some discomfort which may be contributing to the elevation. Have her monitor her blood pressure. Recommend follow up with Dr. Dan Humphreys regarding her blood pressure.

## 2012-04-30 LAB — URINE CULTURE

## 2012-05-14 ENCOUNTER — Ambulatory Visit: Payer: Self-pay | Admitting: Oncology

## 2012-05-26 ENCOUNTER — Ambulatory Visit (INDEPENDENT_AMBULATORY_CARE_PROVIDER_SITE_OTHER): Payer: Medicare Other | Admitting: Internal Medicine

## 2012-05-26 ENCOUNTER — Encounter: Payer: Self-pay | Admitting: Internal Medicine

## 2012-05-26 VITALS — BP 160/80 | HR 78 | Temp 98.7°F | Ht 65.0 in | Wt 179.5 lb

## 2012-05-26 DIAGNOSIS — M1710 Unilateral primary osteoarthritis, unspecified knee: Secondary | ICD-10-CM

## 2012-05-26 DIAGNOSIS — C210 Malignant neoplasm of anus, unspecified: Secondary | ICD-10-CM

## 2012-05-26 DIAGNOSIS — IMO0002 Reserved for concepts with insufficient information to code with codable children: Secondary | ICD-10-CM

## 2012-05-26 DIAGNOSIS — I10 Essential (primary) hypertension: Secondary | ICD-10-CM

## 2012-05-26 DIAGNOSIS — M1712 Unilateral primary osteoarthritis, left knee: Secondary | ICD-10-CM

## 2012-05-26 DIAGNOSIS — N390 Urinary tract infection, site not specified: Secondary | ICD-10-CM | POA: Insufficient documentation

## 2012-05-26 LAB — POCT URINALYSIS DIPSTICK
Blood, UA: NEGATIVE
Nitrite, UA: NEGATIVE
Protein, UA: NEGATIVE
Urobilinogen, UA: 0.2
pH, UA: 5.5

## 2012-05-26 MED ORDER — CIPROFLOXACIN HCL 500 MG PO TABS: 500.0000 mg | ORAL_TABLET | Freq: Two times a day (BID) | ORAL | Status: DC

## 2012-05-26 NOTE — Assessment & Plan Note (Signed)
Patient has completed chemotherapy and radiation. Doing well. Will request recent lab reports from her oncologist.

## 2012-05-26 NOTE — Assessment & Plan Note (Signed)
Symptoms are consistent with osteoarthritis of the left knee. No improvement with steroid and Synvisc injections. Will set up orthopedics evaluation. Question if she may need knee replacement.

## 2012-05-26 NOTE — Assessment & Plan Note (Signed)
Symptoms are consistent with urinary tract infection. Will send urine for culture. Will treat with Cipro twice daily x7 days. Patient will followup if symptoms are not improving.

## 2012-05-26 NOTE — Progress Notes (Signed)
Subjective:    Patient ID: Bailey Howard, female    DOB: 21-Jun-1946, 66 y.o.   MRN: [id number]  HPI 66 year old female with history of anal carcinoma status post chemotherapy and radiation presents for followup. She reports she is generally doing well. She reports that recent scan performed by her oncologist showed no evidence of disease. She has been having regular lab work performed using her port.   She is concerned today about some recent burning with urination and urinary urgency. She denies any fever, chills, flank pain.  In regards to chronic medical issue of hypertension, she reports full compliance with medications. She denies any headache, palpitations, chest pain.  She is also concerned today about gradually worsening pain in her left knee. She reports that she has been seen by sports medicine physician and has had steroid injection and Synvisc injection with no improvement. Pain is worsened with movement of the left knee. There is occasional swelling of the knee. Patient is taking meloxicam with minimal improvement.  Outpatient Encounter Prescriptions as of 05/26/2012  Medication Sig Dispense Refill  . aspirin 81 MG EC tablet Take 81 mg by mouth daily.        . cholecalciferol (VITAMIN D) 1000 UNITS tablet Take 1,000 Units by mouth daily.      . Cholecalciferol (VITAMIN D3) 1000 UNITS CAPS Take 1 capsule by mouth every 30 (thirty) days.        . Cranberry 650 MG CAPS Take 1 capsule by mouth daily. Dosage?       . hydrocortisone (ANUSOL-HC) 25 MG suppository Place 25 mg rectally 2 (two) times daily.      Marland Kitchen lisinopril-hydrochlorothiazide (PRINZIDE,ZESTORETIC) 10-12.5 MG per tablet take 1 tablet by mouth once daily  30 tablet  6  . meloxicam (MOBIC) 15 MG tablet take 1 tablet by mouth once daily if needed  90 tablet  3  . metoprolol (LOPRESSOR) 50 MG tablet Take 1 tablet (50 mg total) by mouth 2 (two) times daily.  120 tablet  6  . Misc Natural Products (GLUCOSAMINE CHONDROITIN ADV  PO) Take 1 tablet by mouth daily.        . Multiple Vitamins-Minerals (CENTRUM SILVER PO) Take 1 tablet by mouth daily.        . niacin (NIASPAN) 1000 MG CR tablet Take 1,000 mg by mouth daily.        . Omega-3 Fatty Acids (FISH OIL) 1200 MG CAPS Take 1 capsule by mouth daily.        Marland Kitchen omeprazole (PRILOSEC) 40 MG capsule Take 40 mg by mouth daily.      . ondansetron (ZOFRAN) 8 MG tablet Take 8 mg by mouth 3 (three) times daily.       . promethazine (PHENERGAN) 12.5 MG tablet Take 12.5 mg by mouth every 6 (six) hours as needed.      . simvastatin (ZOCOR) 10 MG tablet Take 1 tablet (10 mg total) by mouth at bedtime.  90 tablet  3  . traMADol (ULTRAM) 50 MG tablet Take 50 mg by mouth 2 (two) times daily as needed.      . vitamin C (ASCORBIC ACID) 500 MG tablet Take 500 mg by mouth daily.        . vitamin E 400 UNIT capsule Take 400 Units by mouth daily.        . [DISCONTINUED] omeprazole (PRILOSEC) 40 MG capsule Take 1 capsule (40 mg total) by mouth daily.  30 capsule  1  . ciprofloxacin (CIPRO)  500 MG tablet Take 1 tablet (500 mg total) by mouth 2 (two) times daily.  14 tablet  0  . [DISCONTINUED] Vitamin D, Ergocalciferol, (DRISDOL) 50000 UNITS CAPS take 1 capsule by mouth MONTLY  3 capsule  0   BP 160/80  Pulse 78  Temp 98.7 F (37.1 C) (Oral)  Ht 5\' 5"  (1.651 m)  Wt 179 lb 8 oz (81.421 kg)  BMI 29.87 kg/m2  SpO2 96%  Review of Systems  Constitutional: Negative for fever, chills, appetite change, fatigue and unexpected weight change.  HENT: Negative for ear pain, congestion, sore throat, trouble swallowing, neck pain, voice change and sinus pressure.   Eyes: Negative for visual disturbance.  Respiratory: Negative for cough, shortness of breath, wheezing and stridor.   Cardiovascular: Negative for chest pain, palpitations and leg swelling.  Gastrointestinal: Negative for nausea, vomiting, abdominal pain, diarrhea, constipation, blood in stool, abdominal distention and anal bleeding.    Genitourinary: Positive for urgency and frequency. Negative for dysuria, flank pain, decreased urine volume and pelvic pain.  Musculoskeletal: Positive for joint swelling and arthralgias. Negative for myalgias and gait problem.  Skin: Negative for color change and rash.  Neurological: Negative for dizziness and headaches.  Hematological: Negative for adenopathy. Does not bruise/bleed easily.  Psychiatric/Behavioral: Negative for suicidal ideas, sleep disturbance and dysphoric mood. The patient is not nervous/anxious.        Objective:   Physical Exam  Constitutional: She is oriented to person, place, and time. She appears well-developed and well-nourished. No distress.  HENT:  Head: Normocephalic and atraumatic.  Right Ear: External ear normal.  Left Ear: External ear normal.  Nose: Nose normal.  Mouth/Throat: Oropharynx is clear and moist. No oropharyngeal exudate.  Eyes: Conjunctivae normal are normal. Pupils are equal, round, and reactive to light. Right eye exhibits no discharge. Left eye exhibits no discharge. No scleral icterus.  Neck: Normal range of motion. Neck supple. No tracheal deviation present. No thyromegaly present.  Cardiovascular: Normal rate, regular rhythm, normal heart sounds and intact distal pulses.  Exam reveals no gallop and no friction rub.   No murmur heard. Pulmonary/Chest: Effort normal and breath sounds normal. No respiratory distress. She has no wheezes. She has no rales. She exhibits no tenderness.  Musculoskeletal: She exhibits no edema and no tenderness.       Left knee: She exhibits swelling. She exhibits normal range of motion.  Lymphadenopathy:    She has no cervical adenopathy.  Neurological: She is alert and oriented to person, place, and time. No cranial nerve deficit. She exhibits normal muscle tone. Coordination normal.  Skin: Skin is warm and dry. No rash noted. She is not diaphoretic. No erythema. No pallor.  Psychiatric: She has a normal  mood and affect. Her behavior is normal. Judgment and thought content normal.          Assessment & Plan:

## 2012-05-26 NOTE — Assessment & Plan Note (Signed)
Blood pressure slightly elevated today. Question if she may be having pain in her left knee and dysuria contributing to elevated blood pressure. We'll have her monitor blood pressure at home and call if blood pressure consistently greater than 140/90. We'll also obtain records from her oncologist on recent blood pressure readings and a recent renal function.

## 2012-06-14 ENCOUNTER — Ambulatory Visit: Payer: Self-pay | Admitting: Oncology

## 2012-06-16 ENCOUNTER — Other Ambulatory Visit: Payer: Self-pay | Admitting: General Practice

## 2012-06-16 MED ORDER — LISINOPRIL-HYDROCHLOROTHIAZIDE 10-12.5 MG PO TABS: ORAL_TABLET | ORAL | Status: DC

## 2012-06-16 NOTE — Telephone Encounter (Signed)
Med filled.  

## 2012-07-14 ENCOUNTER — Ambulatory Visit: Payer: Self-pay | Admitting: Oncology

## 2012-07-28 ENCOUNTER — Telehealth: Payer: Self-pay | Admitting: Internal Medicine

## 2012-07-28 NOTE — Telephone Encounter (Signed)
Does the patient need labs before her appointment on 2.17.14  @ 2:00. If so please call the patient.

## 2012-07-28 NOTE — Telephone Encounter (Signed)
Let's do her basic panel for her physical V70.9, CMP, urine microalbumin, lipid profile, TSH, CBC

## 2012-08-02 LAB — COMPREHENSIVE METABOLIC PANEL
Albumin: 3.3 g/dL — ABNORMAL LOW (ref 3.4–5.0)
Anion Gap: 12 (ref 7–16)
Bilirubin,Total: 0.9 mg/dL (ref 0.2–1.0)
Chloride: 105 mmol/L (ref 98–107)
Creatinine: 0.88 mg/dL (ref 0.60–1.30)
EGFR (African American): >60
EGFR (Non-African Amer.): >60
Glucose: 94 mg/dL (ref 65–99)
Potassium: 3.5 mmol/L (ref 3.5–5.1)
SGOT(AST): 24 U/L (ref 15–37)
SGPT (ALT): 24 U/L (ref 12–78)
Sodium: 140 mmol/L (ref 136–145)
Total Protein: 6.4 g/dL (ref 6.4–8.2)

## 2012-08-02 LAB — URINALYSIS, COMPLETE
Bacteria: NONE SEEN
Bilirubin,UR: NEGATIVE
Blood: NEGATIVE
Hyaline Cast: 1
Nitrite: NEGATIVE
Ph: 5 (ref 4.5–8.0)
Specific Gravity: 1.008 (ref 1.003–1.030)
WBC UR: 27 /HPF (ref 0–5)

## 2012-08-02 LAB — CBC CANCER CENTER
Basophil #: 0 x10 3/mm (ref 0.0–0.1)
Basophil %: 0.7 %
Eosinophil #: 0.1 x10 3/mm (ref 0.0–0.7)
HCT: 35.7 % (ref 35.0–47.0)
HGB: 12.2 g/dL (ref 12.0–16.0)
Lymphocyte #: 0.6 x10 3/mm — ABNORMAL LOW (ref 1.0–3.6)
MCH: 30.4 pg (ref 26.0–34.0)
MCHC: 34.2 g/dL (ref 32.0–36.0)
Monocyte #: 0.6 x10 3/mm (ref 0.2–0.9)
Neutrophil %: 77.4 %
Platelet: 261 x10 3/mm (ref 150–440)
RBC: 4.03 10*6/uL (ref 3.80–5.20)
RDW: 15.2 % — ABNORMAL HIGH (ref 11.5–14.5)

## 2012-08-09 ENCOUNTER — Other Ambulatory Visit: Payer: Self-pay | Admitting: *Deleted

## 2012-08-09 DIAGNOSIS — Z0000 Encounter for general adult medical examination without abnormal findings: Secondary | ICD-10-CM

## 2012-08-14 ENCOUNTER — Ambulatory Visit: Payer: Self-pay | Admitting: Oncology

## 2012-08-20 ENCOUNTER — Encounter: Payer: Self-pay | Admitting: *Deleted

## 2012-08-20 DIAGNOSIS — M1990 Unspecified osteoarthritis, unspecified site: Secondary | ICD-10-CM | POA: Insufficient documentation

## 2012-08-20 DIAGNOSIS — I10 Essential (primary) hypertension: Secondary | ICD-10-CM | POA: Insufficient documentation

## 2012-08-20 DIAGNOSIS — E7800 Pure hypercholesterolemia, unspecified: Secondary | ICD-10-CM | POA: Insufficient documentation

## 2012-08-30 ENCOUNTER — Ambulatory Visit: Payer: Medicare Other | Admitting: Internal Medicine

## 2012-09-11 ENCOUNTER — Ambulatory Visit: Payer: Self-pay | Admitting: Oncology

## 2012-09-13 ENCOUNTER — Ambulatory Visit: Payer: Medicare Other | Admitting: Internal Medicine

## 2012-10-01 ENCOUNTER — Ambulatory Visit: Payer: Self-pay | Admitting: General Surgery

## 2012-10-04 ENCOUNTER — Ambulatory Visit: Payer: Self-pay | Admitting: General Surgery

## 2012-10-05 LAB — URINALYSIS, COMPLETE
Bilirubin,UR: NEGATIVE
Glucose,UR: NEGATIVE mg/dL (ref 0–75)
Ketone: NEGATIVE
Ph: 6 (ref 4.5–8.0)
RBC,UR: <1 /HPF (ref 0–5)
Specific Gravity: 1.004 (ref 1.003–1.030)

## 2012-10-07 ENCOUNTER — Ambulatory Visit (INDEPENDENT_AMBULATORY_CARE_PROVIDER_SITE_OTHER): Payer: Medicare Other | Admitting: General Surgery

## 2012-10-07 ENCOUNTER — Other Ambulatory Visit: Payer: Self-pay

## 2012-10-07 ENCOUNTER — Encounter: Payer: Self-pay | Admitting: General Surgery

## 2012-10-07 VITALS — BP 146/70 | HR 70 | Resp 20 | Ht 66.0 in | Wt 190.0 lb

## 2012-10-07 DIAGNOSIS — N63 Unspecified lump in unspecified breast: Secondary | ICD-10-CM

## 2012-10-07 NOTE — Patient Instructions (Signed)

## 2012-10-07 NOTE — Progress Notes (Signed)
Patient ID: Bailey Howard, female   DOB: 08-11-1945, 67 y.o.   MRN: [id number]  Chief Complaint  Patient presents with  . Follow-up    mammogram    HPI Bailey Howard is a 67 y.o. female.  Patient here today for follow up mammogram. No new breast complaints.  Completed radiation for anal cancer in October 2013.  States there is concerns of right breast and Dr Orlie Dakin will talk to Dr Evette Cristal.  She had core biopsy left breast 1 year ago that was benign, also has history of anal carcinoma. HPI  Past Medical History  Diagnosis Date  . HLD (hyperlipidemia)   . Allergic rhinitis   . Insomnia   . Hernia 7-10    abdominal wall w/ incarcerated bowel, repaired by Dr Wyn Quaker, had wound vac X 4 months and skin graft  . Thoracic aortic aneurysm 2011    by ECHO, followed by Dr Mariah Milling  . Anemia     NOS  . Vitamin D deficiency 08-21-10  . HTN (hypertension) 2007  . 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 Breast lump 2013  . Hernia 2010  . High cholesterol     Past Surgical History  Procedure Laterality Date  . Vesicovaginal fistula closure w/ tah    . Bilateral leg stripping    . Oophorectomy      R  . Bladder tack      x2; last -10/04  . Open lapaotomy for bening colon mass  7/04  . Facial and neck skin cancer      basal cell  . Abdominal hernia repair  03/25/2011  . Colonoscopy  2013    Dr. Evette Cristal  . Port a cath revision  2013  . Cholecystectomy  2012  . Bladder surgery  2003,2006  . Foot surgery  1990  . Abdominal hysterectomy  1990    mass in ovary was benign, 1 ovary removed  . Tummy tuck   2010  . Colon surgery      anal carcinoma with lymph node mets completed radiation and chemotherapy    No family history on file.  Social History History  Substance Use Topics  . Smoking status: Former Smoker -- 0.50 packs/day for 5 years    Quit date: 01/14/2011  . Smokeless tobacco: Never Used  . Alcohol Use: No    Allergies  Allergen Reactions  . Morphine And Related   . Oxycodone      Current Outpatient Prescriptions  Medication Sig Dispense Refill  . aspirin 81 MG EC tablet Take 81 mg by mouth daily.        . cholecalciferol (VITAMIN D) 1000 UNITS tablet Take 1,000 Units by mouth daily.      . Cranberry 650 MG CAPS Take 1 capsule by mouth daily. Dosage?       . lisinopril-hydrochlorothiazide (PRINZIDE,ZESTORETIC) 10-12.5 MG per tablet take 1 tablet by mouth once daily  90 tablet  3  . meloxicam (MOBIC) 15 MG tablet take 1 tablet by mouth once daily if needed  90 tablet  3  . metoprolol (LOPRESSOR) 50 MG tablet Take 1 tablet (50 mg total) by mouth 2 (two) times daily.  120 tablet  6  . Misc Natural Products (GLUCOSAMINE CHONDROITIN ADV PO) Take 1 tablet by mouth daily.        . Multiple Vitamins-Minerals (CENTRUM SILVER PO) Take 1 tablet by mouth daily.        . niacin (NIASPAN) 1000 MG CR  tablet Take 1,000 mg by mouth daily.        . nitrofurantoin, macrocrystal-monohydrate, (MACROBID) 100 MG capsule Take 1 capsule (100 mg total) by mouth 2 (two) times daily.  14 capsule  0  . Omega-3 Fatty Acids (FISH OIL) 1200 MG CAPS Take 1 capsule by mouth daily.        Marland Kitchen omeprazole (PRILOSEC) 40 MG capsule Take 40 mg by mouth daily.      . ondansetron (ZOFRAN) 8 MG tablet Take 8 mg by mouth 3 (three) times daily.       . promethazine (PHENERGAN) 12.5 MG tablet Take 1 tablet (12.5 mg total) by mouth every 6 (six) hours as needed for nausea.  60 tablet  0  . promethazine (PHENERGAN) 12.5 MG tablet Take 1 tablet (12.5 mg total) by mouth every 6 (six) hours as needed for nausea.  60 tablet  0  . promethazine (PHENERGAN) 12.5 MG tablet Take 12.5 mg by mouth every 6 (six) hours as needed.      . simvastatin (ZOCOR) 10 MG tablet Take 1 tablet (10 mg total) by mouth at bedtime.  90 tablet  3  . traMADol (ULTRAM) 50 MG tablet Take 50 mg by mouth 2 (two) times daily as needed.      . vitamin C (ASCORBIC ACID) 500 MG tablet Take 500 mg by mouth daily.        . vitamin E 400 UNIT capsule Take  400 Units by mouth daily.         No current facility-administered medications for this visit.    Review of Systems Review of Systems  Constitutional: Negative.   Respiratory: Positive for shortness of breath.   Cardiovascular: Negative.     Blood pressure 146/70, pulse 70, resp. rate 20, height 5\' 6"  (1.676 m), weight 190 lb (86.183 kg).  Physical Exam Physical Exam  Constitutional: She is oriented to person, place, and time. She appears well-developed and well-nourished.  Eyes: Conjunctivae are normal.  Cardiovascular: Normal rate, regular rhythm and normal pulses.   Pulmonary/Chest: Effort normal and breath sounds normal. Right breast exhibits mass. Right breast exhibits no inverted nipple, no nipple discharge, no skin change and no tenderness. Left breast exhibits no inverted nipple, no mass, no nipple discharge, no skin change and no tenderness.  Abdominal: Soft. Bowel sounds are normal.  Genitourinary: Rectum normal.  Lymphadenopathy:    She has no cervical adenopathy.    She has no axillary adenopathy.  Neurological: She is alert and oriented to person, place, and time.  Skin: Skin is warm.  External anal skin shows well healed area from prior surgery and radiation, no evidence of local occurrence. Scant edema right lower leg. Data Reviewed Recent mammogram was reviewed showing a right breast massUs here showed an irregular hypoechoic mass at 11 o.cl location  Assessment    New right breast mass. Hx of anal carcinoma Colonoscopy last yr showed a mucosal plaque in cecum and needs f/u evaluation    Plan    Core biopsy right breast. Schedule colonoscopy       Dorathy Daft M 10/07/2012, 9:56 AM

## 2012-10-08 ENCOUNTER — Ambulatory Visit (INDEPENDENT_AMBULATORY_CARE_PROVIDER_SITE_OTHER): Payer: Medicare Other | Admitting: Internal Medicine

## 2012-10-08 ENCOUNTER — Encounter: Payer: Self-pay | Admitting: Internal Medicine

## 2012-10-08 VITALS — BP 178/84 | HR 77 | Temp 98.4°F | Wt 189.0 lb

## 2012-10-08 DIAGNOSIS — E785 Hyperlipidemia, unspecified: Secondary | ICD-10-CM

## 2012-10-08 DIAGNOSIS — N63 Unspecified lump in unspecified breast: Secondary | ICD-10-CM

## 2012-10-08 DIAGNOSIS — C210 Malignant neoplasm of anus, unspecified: Secondary | ICD-10-CM

## 2012-10-08 DIAGNOSIS — M1710 Unilateral primary osteoarthritis, unspecified knee: Secondary | ICD-10-CM

## 2012-10-08 DIAGNOSIS — I10 Essential (primary) hypertension: Secondary | ICD-10-CM

## 2012-10-08 DIAGNOSIS — E039 Hypothyroidism, unspecified: Secondary | ICD-10-CM

## 2012-10-08 DIAGNOSIS — M1712 Unilateral primary osteoarthritis, left knee: Secondary | ICD-10-CM

## 2012-10-08 DIAGNOSIS — D510 Vitamin B12 deficiency anemia due to intrinsic factor deficiency: Secondary | ICD-10-CM

## 2012-10-08 DIAGNOSIS — IMO0002 Reserved for concepts with insufficient information to code with codable children: Secondary | ICD-10-CM

## 2012-10-08 LAB — MICROALBUMIN / CREATININE URINE RATIO
Microalb Creat Ratio: 10.9 mg/g (ref 0.0–30.0)
Microalb, Ur: 3.2 mg/dL — ABNORMAL HIGH (ref 0.0–1.9)

## 2012-10-08 MED ORDER — LISINOPRIL-HYDROCHLOROTHIAZIDE 20-25 MG PO TABS: 1.0000 | ORAL_TABLET | Freq: Every day | ORAL | Status: DC

## 2012-10-08 NOTE — Assessment & Plan Note (Signed)
Treatment course including chemotherapy and radiation therapy completed. Patient reports she is scheduled for followup colonoscopy later this month. Will continue to monitor.

## 2012-10-08 NOTE — Progress Notes (Signed)
Subjective:    Patient ID: Bailey Howard, female    DOB: [birth year], 67 y.o.   MRN: [id number]  HPI 67 year old female with history of anal carcinoma, hypertension, hyperlipidemia presents for followup. In interim since her last visit, she completed chemotherapy and radiation therapy for anal carcinoma. She reports she is generally doing well. She notes that she recently had mammogram which showed abnormality in the right lateral breast. She was seen by her general surgeon yesterday and had biopsy performed. Results are pending. She is planning for colonoscopy later this month. She has followup with her oncologist and radiation oncologist every 6 months.  In regards to hypertension, she reports blood pressures have been elevated. She denies any headache, palpitations, chest pain. She reports compliance with her medication.  Outpatient Encounter Prescriptions as of 10/08/2012  Medication Sig Dispense Refill  . aspirin 81 MG EC tablet Take 81 mg by mouth daily.        . cholecalciferol (VITAMIN D) 1000 UNITS tablet Take 1,000 Units by mouth daily.      . Cranberry 650 MG CAPS Take 1 capsule by mouth daily. Dosage?       . meloxicam (MOBIC) 15 MG tablet take 1 tablet by mouth once daily if needed  90 tablet  3  . metoprolol (LOPRESSOR) 50 MG tablet Take 1 tablet (50 mg total) by mouth 2 (two) times daily.  120 tablet  6  . Misc Natural Products (GLUCOSAMINE CHONDROITIN ADV PO) Take 1 tablet by mouth daily.        . Multiple Vitamins-Minerals (CENTRUM SILVER PO) Take 1 tablet by mouth daily.        . niacin (NIASPAN) 1000 MG CR tablet Take 1,000 mg by mouth daily.        . Omega-3 Fatty Acids (FISH OIL) 1200 MG CAPS Take 1 capsule by mouth daily.        Marland Kitchen omeprazole (PRILOSEC) 40 MG capsule Take 40 mg by mouth daily.      . ondansetron (ZOFRAN) 8 MG tablet Take 8 mg by mouth 3 (three) times daily.       . promethazine (PHENERGAN) 12.5 MG tablet Take 12.5 mg by mouth every 6 (six) hours as needed.       . simvastatin (ZOCOR) 10 MG tablet Take 1 tablet (10 mg total) by mouth at bedtime.  90 tablet  3  . traMADol (ULTRAM) 50 MG tablet Take 50 mg by mouth 2 (two) times daily as needed.      . vitamin C (ASCORBIC ACID) 500 MG tablet Take 500 mg by mouth daily.        . vitamin E 400 UNIT capsule Take 400 Units by mouth daily.        . [DISCONTINUED] lisinopril-hydrochlorothiazide (PRINZIDE,ZESTORETIC) 10-12.5 MG per tablet take 1 tablet by mouth once daily  90 tablet  3  . lisinopril-hydrochlorothiazide (PRINZIDE,ZESTORETIC) 20-25 MG per tablet Take 1 tablet by mouth daily.  90 tablet  3  . nitrofurantoin, macrocrystal-monohydrate, (MACROBID) 100 MG capsule Take 1 capsule (100 mg total) by mouth 2 (two) times daily.  14 capsule  0  . promethazine (PHENERGAN) 12.5 MG tablet Take 1 tablet (12.5 mg total) by mouth every 6 (six) hours as needed for nausea.  60 tablet  0  . promethazine (PHENERGAN) 12.5 MG tablet Take 1 tablet (12.5 mg total) by mouth every 6 (six) hours as needed for nausea.  60 tablet  0   No facility-administered encounter medications on file  as of 10/08/2012.   BP 178/84  Pulse 77  Temp(Src) 98.4 F (36.9 C) (Oral)  Wt 189 lb (85.73 kg)  BMI 30.52 kg/m2  SpO2 96%  Review of Systems  Constitutional: Negative for fever, chills, appetite change, fatigue and unexpected weight change.  HENT: Negative for ear pain, congestion, sore throat, trouble swallowing, neck pain, voice change and sinus pressure.   Eyes: Negative for visual disturbance.  Respiratory: Negative for cough, shortness of breath, wheezing and stridor.   Cardiovascular: Negative for chest pain, palpitations and leg swelling.  Gastrointestinal: Negative for nausea, vomiting, abdominal pain, diarrhea, constipation, blood in stool, abdominal distention and anal bleeding.  Genitourinary: Negative for dysuria and flank pain.  Musculoskeletal: Negative for myalgias, arthralgias and gait problem.  Skin: Negative for  color change and rash.  Neurological: Negative for dizziness and headaches.  Hematological: Negative for adenopathy. Does not bruise/bleed easily.  Psychiatric/Behavioral: Negative for suicidal ideas, sleep disturbance and dysphoric mood. The patient is not nervous/anxious.        Objective:   Physical Exam  Constitutional: She is oriented to person, place, and time. She appears well-developed and well-nourished. No distress.  HENT:  Head: Normocephalic and atraumatic.  Right Ear: External ear normal.  Left Ear: External ear normal.  Nose: Nose normal.  Mouth/Throat: Oropharynx is clear and moist. No oropharyngeal exudate.  Eyes: Conjunctivae are normal. Pupils are equal, round, and reactive to light. Right eye exhibits no discharge. Left eye exhibits no discharge. No scleral icterus.  Neck: Normal range of motion. Neck supple. No tracheal deviation present. No thyromegaly present.  Cardiovascular: Normal rate, regular rhythm, normal heart sounds and intact distal pulses.  Exam reveals no gallop and no friction rub.   No murmur heard. Pulmonary/Chest: Effort normal and breath sounds normal. No respiratory distress. She has no wheezes. She has no rales. She exhibits no tenderness.  Musculoskeletal: Normal range of motion. She exhibits no edema and no tenderness.  Lymphadenopathy:    She has no cervical adenopathy.  Neurological: She is alert and oriented to person, place, and time. No cranial nerve deficit. She exhibits normal muscle tone. Coordination normal.  Skin: Skin is warm and dry. No rash noted. She is not diaphoretic. No erythema. No pallor.  Psychiatric: She has a normal mood and affect. Her behavior is normal. Judgment and thought content normal.          Assessment & Plan:

## 2012-10-08 NOTE — Assessment & Plan Note (Signed)
BP Readings from Last 3 Encounters:  10/08/12 178/84  10/07/12 146/70  03/11/12 110/68   Blood pressure elevated today. Patient reports it is also been elevated at presents with her oncologist and at home. Will increase lisinopril hydrochlorothiazide to 20/25. Patient will monitor blood pressure at home and call if consistently greater than 140/90. Return to clinic in one week for blood pressure recheck and lab work including creatinine and potassium. Followup in one month.

## 2012-10-08 NOTE — Assessment & Plan Note (Signed)
Patient noted to have breast mass on mammogram in the right lateral breast. She was seen by her surgeon yesterday and had biopsy performed. Pathology is pending. Will follow.

## 2012-10-08 NOTE — Patient Instructions (Signed)
Labs in 1 week.  Follow up in 4 weeks. 

## 2012-10-08 NOTE — Assessment & Plan Note (Signed)
Significant left knee pain related to osteoarthritis. No improvement with steroid injections with orthopedics. Planning for a likely knee replacement in the future. Patient will continue to follow with orthopedic

## 2012-10-12 ENCOUNTER — Ambulatory Visit: Payer: Self-pay | Admitting: Oncology

## 2012-10-12 ENCOUNTER — Telehealth: Payer: Self-pay | Admitting: *Deleted

## 2012-10-12 NOTE — Telephone Encounter (Signed)
Notify pt of benign breast biopsy results per Dr Evette Cristal, follow up as scheduled

## 2012-10-12 NOTE — Telephone Encounter (Signed)
Notified pt of results.  Pt states she is to be scheduled for colonoscopy soon

## 2012-10-15 ENCOUNTER — Other Ambulatory Visit (INDEPENDENT_AMBULATORY_CARE_PROVIDER_SITE_OTHER): Payer: Medicare Other

## 2012-10-15 DIAGNOSIS — D510 Vitamin B12 deficiency anemia due to intrinsic factor deficiency: Secondary | ICD-10-CM

## 2012-10-15 DIAGNOSIS — E039 Hypothyroidism, unspecified: Secondary | ICD-10-CM

## 2012-10-15 DIAGNOSIS — I10 Essential (primary) hypertension: Secondary | ICD-10-CM

## 2012-10-15 DIAGNOSIS — E785 Hyperlipidemia, unspecified: Secondary | ICD-10-CM

## 2012-10-15 DIAGNOSIS — Z0000 Encounter for general adult medical examination without abnormal findings: Secondary | ICD-10-CM

## 2012-10-15 LAB — CBC WITH DIFFERENTIAL/PLATELET
Basophils Absolute: 0 10*3/uL (ref 0.0–0.1)
Basophils Relative: 0.4 % (ref 0.0–3.0)
Eosinophils Absolute: 0.1 10*3/uL (ref 0.0–0.7)
HCT: 36.3 % (ref 36.0–46.0)
Hemoglobin: 12.2 g/dL (ref 12.0–15.0)
Lymphocytes Relative: 12.3 % (ref 12.0–46.0)
Lymphs Abs: 0.6 10*3/uL — ABNORMAL LOW (ref 0.7–4.0)
MCHC: 33.7 g/dL (ref 30.0–36.0)
MCV: 90.3 fl (ref 78.0–100.0)
Monocytes Absolute: 0.5 10*3/uL (ref 0.1–1.0)
Neutro Abs: 3.8 10*3/uL (ref 1.4–7.7)
RBC: 4.02 Mil/uL (ref 3.87–5.11)
RDW: 15.1 % — ABNORMAL HIGH (ref 11.5–14.6)

## 2012-10-15 LAB — COMPREHENSIVE METABOLIC PANEL
ALT: 15 U/L (ref 0–35)
AST: 20 U/L (ref 0–37)
Alkaline Phosphatase: 75 U/L (ref 39–117)
CO2: 22 mEq/L (ref 19–32)
Creatinine, Ser: 0.8 mg/dL (ref 0.4–1.2)
GFR: 72.4 mL/min (ref >60.00)
Sodium: 136 mEq/L (ref 135–145)
Total Bilirubin: 1.4 mg/dL — ABNORMAL HIGH (ref 0.3–1.2)
Total Protein: 6.7 g/dL (ref 6.0–8.3)

## 2012-10-15 LAB — LIPID PANEL
Cholesterol: 130 mg/dL (ref 0–200)
Total CHOL/HDL Ratio: 3
Total CHOL/HDL Ratio: 3
Triglycerides: 230 mg/dL — ABNORMAL HIGH (ref 0.0–149.0)
VLDL: 46.6 mg/dL — ABNORMAL HIGH (ref 0.0–40.0)

## 2012-10-15 LAB — LDL CHOLESTEROL, DIRECT
Direct LDL: 62.5 mg/dL
Direct LDL: 62.9 mg/dL

## 2012-10-15 LAB — MICROALBUMIN / CREATININE URINE RATIO
Creatinine,U: 30.3 mg/dL
Microalb Creat Ratio: 4 mg/g (ref 0.0–30.0)

## 2012-10-18 MED ORDER — VITAMIN B-12 1000 MCG SL SUBL: 2000.0000 ug | SUBLINGUAL_TABLET | Freq: Every day | SUBLINGUAL | Status: DC

## 2012-10-18 NOTE — Addendum Note (Signed)
Addended by: Theola Sequin on: 10/18/2012 09:55 AM   Modules accepted: Orders

## 2012-10-22 LAB — PATHOLOGY

## 2012-11-02 ENCOUNTER — Telehealth: Payer: Self-pay | Admitting: *Deleted

## 2012-11-02 DIAGNOSIS — Z1211 Encounter for screening for malignant neoplasm of colon: Secondary | ICD-10-CM

## 2012-11-02 MED ORDER — POLYETHYLENE GLYCOL 3350 17 GM/SCOOP PO POWD: ORAL | Status: DC

## 2012-11-02 NOTE — Telephone Encounter (Signed)
Patient called in to schedule colonoscopy. This has been arranged for 12-01-12 at [facility]. She has been instructed to discontinue fish oil one week prior to procedure and aware it is okay to continue 81 mg aspirin. Patient to call the office if she has further questions. Miralax prescription has been sent to patient's pharmacy.

## 2012-11-11 ENCOUNTER — Ambulatory Visit: Payer: Self-pay | Admitting: Oncology

## 2012-11-16 ENCOUNTER — Ambulatory Visit (INDEPENDENT_AMBULATORY_CARE_PROVIDER_SITE_OTHER): Payer: Medicare Other | Admitting: Internal Medicine

## 2012-11-16 ENCOUNTER — Encounter: Payer: Self-pay | Admitting: Internal Medicine

## 2012-11-16 VITALS — BP 178/72 | HR 74 | Temp 97.9°F | Wt 193.0 lb

## 2012-11-16 DIAGNOSIS — M1712 Unilateral primary osteoarthritis, left knee: Secondary | ICD-10-CM

## 2012-11-16 DIAGNOSIS — M1710 Unilateral primary osteoarthritis, unspecified knee: Secondary | ICD-10-CM

## 2012-11-16 DIAGNOSIS — I10 Essential (primary) hypertension: Secondary | ICD-10-CM

## 2012-11-16 DIAGNOSIS — E781 Pure hyperglyceridemia: Secondary | ICD-10-CM

## 2012-11-16 DIAGNOSIS — K219 Gastro-esophageal reflux disease without esophagitis: Secondary | ICD-10-CM

## 2012-11-16 DIAGNOSIS — R17 Unspecified jaundice: Secondary | ICD-10-CM

## 2012-11-16 DIAGNOSIS — M549 Dorsalgia, unspecified: Secondary | ICD-10-CM

## 2012-11-16 LAB — COMPREHENSIVE METABOLIC PANEL
ALT: 15 U/L (ref 0–35)
AST: 20 U/L (ref 0–37)
Albumin: 3.9 g/dL (ref 3.5–5.2)
Alkaline Phosphatase: 75 U/L (ref 39–117)
Potassium: 4.5 mEq/L (ref 3.5–5.1)
Sodium: 138 mEq/L (ref 135–145)
Total Protein: 6.5 g/dL (ref 6.0–8.3)

## 2012-11-16 LAB — POCT URINALYSIS DIPSTICK
Ketones, UA: NEGATIVE
Protein, UA: NEGATIVE
Spec Grav, UA: 1.01
pH, UA: 5

## 2012-11-16 LAB — LIPID PANEL
Cholesterol: 139 mg/dL (ref 0–200)
Total CHOL/HDL Ratio: 3
VLDL: 54 mg/dL — ABNORMAL HIGH (ref 0.0–40.0)

## 2012-11-16 MED ORDER — TRAMADOL HCL 50 MG PO TABS: 50.0000 mg | ORAL_TABLET | Freq: Two times a day (BID) | ORAL | Status: DC | PRN

## 2012-11-16 NOTE — Assessment & Plan Note (Signed)
Low back and hip pain most consistent with osteoarthritis. However, will check urinalysis today given history of recurrent UTI.

## 2012-11-16 NOTE — Assessment & Plan Note (Signed)
Elevated bilirubin noted on recent labs. Will repeat today.

## 2012-11-16 NOTE — Progress Notes (Signed)
Subjective:    Patient ID: Bailey Howard, female    DOB: 11-13-1945, 67 y.o.   MRN: [id number]  HPI 67YO female with h/o SCC anus s/p XRT, HTN presents for follow up. Concerned today about worsening symptoms of acid reflux with burning pain in her upper abdomen in chest. Occurs daily. No typically affected by food intake. Also has periodic watery, non-bloody diarrhea. No lower abdominal pain, however does note some distension and bloating on occasion. Mild low back pain, which is chronic. No dysuria, hematuria, fever, chills, flank pain. Scheduled for colonoscopy in 2 weeks. Taking Omeprazole with no improvement.  Also concerned about ongoing bilateral knee pain. Has been seen by orthopedics, Dr. Ernest Pine, in the past. No improvement with previous cortisone injection. Taking meloxicam with no improvement. Pain described as aching which is made worse by movement and weight bearing.  Checking BP at home, typically 140/60s. Compliant with medications. No chest pain, headache, palpitations.  Outpatient Encounter Prescriptions as of 11/16/2012  Medication Sig Dispense Refill  . aspirin 81 MG EC tablet Take 81 mg by mouth daily.        . cholecalciferol (VITAMIN D) 1000 UNITS tablet Take 1,000 Units by mouth daily.      . Cranberry 650 MG CAPS Take 1 capsule by mouth daily. Dosage?       . Cyanocobalamin (VITAMIN B-12) 1000 MCG SUBL Place 2 tablets (2,000 mcg total) under the tongue daily.  60 tablet  6  . lisinopril-hydrochlorothiazide (PRINZIDE,ZESTORETIC) 20-25 MG per tablet Take 1 tablet by mouth daily.  90 tablet  3  . meloxicam (MOBIC) 15 MG tablet take 1 tablet by mouth once daily if needed  90 tablet  3  . metoprolol (LOPRESSOR) 50 MG tablet Take 1 tablet (50 mg total) by mouth 2 (two) times daily.  120 tablet  6  . Misc Natural Products (GLUCOSAMINE CHONDROITIN ADV PO) Take 1 tablet by mouth daily.        . Multiple Vitamins-Minerals (CENTRUM SILVER PO) Take 1 tablet by mouth daily.        .  nitrofurantoin, macrocrystal-monohydrate, (MACROBID) 100 MG capsule Take 1 capsule (100 mg total) by mouth 2 (two) times daily.  14 capsule  0  . Omega-3 Fatty Acids (FISH OIL) 1200 MG CAPS Take 1 capsule by mouth daily.        Marland Kitchen omeprazole (PRILOSEC) 40 MG capsule Take 40 mg by mouth daily.      . ondansetron (ZOFRAN) 8 MG tablet Take 8 mg by mouth 3 (three) times daily.       . simvastatin (ZOCOR) 10 MG tablet Take 1 tablet (10 mg total) by mouth at bedtime.  90 tablet  3  . traMADol (ULTRAM) 50 MG tablet Take 1 tablet (50 mg total) by mouth 2 (two) times daily as needed.  60 tablet  3  . vitamin C (ASCORBIC ACID) 500 MG tablet Take 500 mg by mouth daily.        . vitamin E 400 UNIT capsule Take 400 Units by mouth daily.        . [DISCONTINUED] traMADol (ULTRAM) 50 MG tablet Take 50 mg by mouth 2 (two) times daily as needed.      . polyethylene glycol powder (GLYCOLAX/MIRALAX) powder 255 grams one bottle for colonoscopy prep  255 g  0  . promethazine (PHENERGAN) 12.5 MG tablet Take 1 tablet (12.5 mg total) by mouth every 6 (six) hours as needed for nausea.  60 tablet  0  .  promethazine (PHENERGAN) 12.5 MG tablet Take 1 tablet (12.5 mg total) by mouth every 6 (six) hours as needed for nausea.  60 tablet  0  . promethazine (PHENERGAN) 12.5 MG tablet Take 12.5 mg by mouth every 6 (six) hours as needed.      . [DISCONTINUED] niacin (NIASPAN) 1000 MG CR tablet Take 1,000 mg by mouth daily.         No facility-administered encounter medications on file as of 11/16/2012.   BP 180/84  Pulse 74  Temp(Src) 97.9 F (36.6 C) (Oral)  Wt 193 lb (87.544 kg)  BMI 31.17 kg/m2  SpO2 95%  Review of Systems  Constitutional: Negative for fever, chills, appetite change, fatigue and unexpected weight change.  HENT: Negative for ear pain, congestion, sore throat, trouble swallowing, neck pain, voice change and sinus pressure.   Eyes: Negative for visual disturbance.  Respiratory: Negative for cough, shortness  of breath, wheezing and stridor.   Cardiovascular: Negative for chest pain, palpitations and leg swelling.  Gastrointestinal: Positive for abdominal pain. Negative for nausea, vomiting, diarrhea, constipation, blood in stool, abdominal distention and anal bleeding.  Genitourinary: Negative for dysuria and flank pain.  Musculoskeletal: Positive for myalgias and arthralgias. Negative for gait problem.  Skin: Negative for color change and rash.  Neurological: Negative for dizziness and headaches.  Hematological: Negative for adenopathy. Does not bruise/bleed easily.  Psychiatric/Behavioral: Negative for suicidal ideas, sleep disturbance and dysphoric mood. The patient is not nervous/anxious.        Objective:   Physical Exam  Constitutional: She is oriented to person, place, and time. She appears well-developed and well-nourished. No distress.  HENT:  Head: Normocephalic and atraumatic.  Right Ear: External ear normal.  Left Ear: External ear normal.  Nose: Nose normal.  Mouth/Throat: Oropharynx is clear and moist. No oropharyngeal exudate.  Eyes: Conjunctivae are normal. Pupils are equal, round, and reactive to light. Right eye exhibits no discharge. Left eye exhibits no discharge. No scleral icterus.  Neck: Normal range of motion. Neck supple. No tracheal deviation present. No thyromegaly present.  Cardiovascular: Normal rate, regular rhythm, normal heart sounds and intact distal pulses.  Exam reveals no gallop and no friction rub.   No murmur heard. Pulmonary/Chest: Effort normal and breath sounds normal. No respiratory distress. She has no wheezes. She has no rales. She exhibits no tenderness.  Abdominal: Soft. Bowel sounds are normal. She exhibits no distension and no mass. There is no tenderness. There is no rebound and no guarding.  Musculoskeletal: She exhibits no edema and no tenderness.       Right knee: She exhibits decreased range of motion and swelling. She exhibits no effusion.        Left knee: She exhibits decreased range of motion and swelling. She exhibits no effusion.       Lumbar back: She exhibits pain. She exhibits normal range of motion, no tenderness and no bony tenderness.  Lymphadenopathy:    She has no cervical adenopathy.  Neurological: She is alert and oriented to person, place, and time. No cranial nerve deficit. She exhibits normal muscle tone. Coordination normal.  Skin: Skin is warm and dry. No rash noted. She is not diaphoretic. No erythema. No pallor.  Psychiatric: She has a normal mood and affect. Her behavior is normal. Judgment and thought content normal.          Assessment & Plan:

## 2012-11-16 NOTE — Assessment & Plan Note (Signed)
Patient has been off niacin for over 2 weeks. Will repeat lipids today to check triglycerides.

## 2012-11-16 NOTE — Assessment & Plan Note (Signed)
  BP Readings from Last 3 Encounters:  11/16/12 180/84  10/08/12 178/84  10/07/12 146/70   Blood pressure continues to be elevated in clinic. However, patient reports blood pressure is much lower at home typically 140s over 60s. We'll have her monitor at home several times per week and bring report on readings to clinic weekly. For now, continue current medications. Follow up 4 weeks or sooner if needed.

## 2012-11-16 NOTE — Assessment & Plan Note (Signed)
Severe osteoarthritis of the left knee. No improvement after cortisone injection and Synvisc injection. Will set up followup with orthopedics to discuss knee replacement.

## 2012-11-16 NOTE — Assessment & Plan Note (Addendum)
Worsening symptoms of acid reflux including epigastric burning and belching. Will check H. pylori today. She is scheduled for colonoscopy in 2 weeks. Recommended that she also have upper endoscopy at this time. We'll continue omeprazole. Follow up 4 weeks.

## 2012-11-17 LAB — LDL CHOLESTEROL, DIRECT: Direct LDL: 64.7 mg/dL

## 2012-11-18 ENCOUNTER — Encounter: Payer: Self-pay | Admitting: *Deleted

## 2012-11-18 LAB — URINE CULTURE: Organism ID, Bacteria: NO GROWTH

## 2012-11-19 ENCOUNTER — Other Ambulatory Visit (INDEPENDENT_AMBULATORY_CARE_PROVIDER_SITE_OTHER): Payer: Medicare Other | Admitting: *Deleted

## 2012-11-19 DIAGNOSIS — R319 Hematuria, unspecified: Secondary | ICD-10-CM

## 2012-11-19 LAB — POCT URINALYSIS DIPSTICK
Blood, UA: NEGATIVE
Nitrite, UA: NEGATIVE
Urobilinogen, UA: 0.2
pH, UA: 5

## 2012-11-26 ENCOUNTER — Other Ambulatory Visit: Payer: Self-pay | Admitting: General Surgery

## 2012-11-26 DIAGNOSIS — Z86004 Personal history of in-situ neoplasm of other and unspecified digestive organs: Secondary | ICD-10-CM

## 2012-11-29 ENCOUNTER — Telehealth: Payer: Self-pay | Admitting: *Deleted

## 2012-11-29 NOTE — Telephone Encounter (Signed)
BP reading 135/76 on Friday and they are better when she is not in the office.

## 2012-11-30 ENCOUNTER — Other Ambulatory Visit: Payer: Self-pay | Admitting: General Surgery

## 2012-11-30 NOTE — Progress Notes (Signed)
PRE-OPERATIVE PATIENT ORDERS  Patient Name: Bailey Howard  Physician: Kieth Brightly, MD  Admitting DX: history of anal carcinoma. F/u of cecal lesion. GERD Surgery Date: 12/01/12  Allergies: Morphine and related and Oxycodone  Patient Status: SDS (same day surgery)  Laboratory Studies: None  Blood Products: None  Radiology Studies: None  Cardiology Studies: None  Anesthesia: Propofol  Permit: Colonoscopy with biopsy or polyp removal as indicated. Upper endoscopy with biopsy as indicated  Prep: No prep requested  Medications: IV: LR at 100 cc/hr   Misc: The history and physical will be updated the morning of the procedure.  [facility] [address] [place], [place] [address]  SIGNATURE:  Signed by    Kieth Brightly, MD

## 2012-12-01 ENCOUNTER — Ambulatory Visit: Payer: Self-pay | Admitting: General Surgery

## 2012-12-01 DIAGNOSIS — K209 Esophagitis, unspecified: Secondary | ICD-10-CM

## 2012-12-01 DIAGNOSIS — Z85048 Personal history of other malignant neoplasm of rectum, rectosigmoid junction, and anus: Secondary | ICD-10-CM

## 2012-12-01 DIAGNOSIS — K5730 Diverticulosis of large intestine without perforation or abscess without bleeding: Secondary | ICD-10-CM

## 2012-12-01 DIAGNOSIS — K2940 Chronic atrophic gastritis without bleeding: Secondary | ICD-10-CM

## 2012-12-07 ENCOUNTER — Telehealth: Payer: Self-pay | Admitting: General Surgery

## 2012-12-07 ENCOUNTER — Telehealth: Payer: Self-pay | Admitting: *Deleted

## 2012-12-07 ENCOUNTER — Encounter: Payer: Self-pay | Admitting: General Surgery

## 2012-12-07 DIAGNOSIS — K219 Gastro-esophageal reflux disease without esophagitis: Secondary | ICD-10-CM

## 2012-12-07 MED ORDER — PANTOPRAZOLE SODIUM 40 MG PO TBEC: 40.0000 mg | DELAYED_RELEASE_TABLET | Freq: Every day | ORAL | Status: DC

## 2012-12-07 NOTE — Telephone Encounter (Signed)
OK. We should continue to monitor.  She should check 1-2 times per week.

## 2012-12-07 NOTE — Telephone Encounter (Signed)
Notified pt to continue to monitor BP readings 1-2 times weekly and notify office of readings.

## 2012-12-07 NOTE — Telephone Encounter (Signed)
Path report on esophagus showed esophagitis with ulceration. Pt has developed symptoms while on Prilosec. Therefore she was advised to stop Prilosec. Rx  Sent for Protonix 40 mg daily. Also advised on elevation of HOB and to avoid eating or drinking fluids for at least 2 hrs prior to retiring for the night. Pt voiced understanding. She is to see me in 6 weeks.

## 2012-12-07 NOTE — Telephone Encounter (Signed)
Went to the pharmacy and took her BP on Friday 5/23, it was 141/68

## 2012-12-07 NOTE — Addendum Note (Signed)
Addended by: Currie Paris on: 12/07/2012 01:37 PM   Modules accepted: Orders

## 2012-12-07 NOTE — Telephone Encounter (Signed)
Pt aware of medication changes.  RX sent electronically.

## 2012-12-08 ENCOUNTER — Encounter: Payer: Self-pay | Admitting: Internal Medicine

## 2012-12-08 ENCOUNTER — Telehealth: Payer: Self-pay | Admitting: Internal Medicine

## 2012-12-08 NOTE — Telephone Encounter (Signed)
Left message stating her BP today was 141/63

## 2012-12-16 ENCOUNTER — Telehealth: Payer: Self-pay | Admitting: *Deleted

## 2012-12-16 NOTE — Telephone Encounter (Signed)
Patient left voicemail stating her BP was 142/60 on yesterday

## 2012-12-21 ENCOUNTER — Telehealth: Payer: Self-pay | Admitting: Internal Medicine

## 2012-12-21 ENCOUNTER — Encounter: Payer: Self-pay | Admitting: General Surgery

## 2012-12-21 NOTE — Telephone Encounter (Signed)
Patient left message stating her BP was 152/84 at Dr. Ernest Pine office today

## 2012-12-22 ENCOUNTER — Encounter: Payer: Self-pay | Admitting: General Surgery

## 2012-12-23 ENCOUNTER — Encounter: Payer: Self-pay | Admitting: Internal Medicine

## 2012-12-23 ENCOUNTER — Ambulatory Visit (INDEPENDENT_AMBULATORY_CARE_PROVIDER_SITE_OTHER): Payer: Medicare Other | Admitting: Internal Medicine

## 2012-12-23 VITALS — BP 170/78 | HR 70 | Temp 98.3°F | Wt 193.0 lb

## 2012-12-23 DIAGNOSIS — E538 Deficiency of other specified B group vitamins: Secondary | ICD-10-CM | POA: Insufficient documentation

## 2012-12-23 DIAGNOSIS — I10 Essential (primary) hypertension: Secondary | ICD-10-CM

## 2012-12-23 DIAGNOSIS — M1710 Unilateral primary osteoarthritis, unspecified knee: Secondary | ICD-10-CM

## 2012-12-23 DIAGNOSIS — C210 Malignant neoplasm of anus, unspecified: Secondary | ICD-10-CM

## 2012-12-23 DIAGNOSIS — M1712 Unilateral primary osteoarthritis, left knee: Secondary | ICD-10-CM

## 2012-12-23 LAB — HM PAP SMEAR

## 2012-12-23 MED ORDER — CYANOCOBALAMIN 1000 MCG/ML IJ SOLN
1000.0000 ug | Freq: Once | INTRAMUSCULAR | Status: AC
  Administered 2012-12-23: 1000 ug via INTRAMUSCULAR

## 2012-12-23 NOTE — Assessment & Plan Note (Signed)
Symptoms of pain in the left knee improved after recent cortisone injection. Will continue to monitor.

## 2012-12-23 NOTE — Assessment & Plan Note (Signed)
Reviewed recent colonoscopy report with patient. Scheduled to have chemotherapy port removed. Will follow.

## 2012-12-23 NOTE — Assessment & Plan Note (Signed)
BP Readings from Last 3 Encounters:  12/23/12 170/78  11/16/12 178/72  10/08/12 178/84   Blood pressure slightly elevated in clinic but has been well-controlled at home and at other doctor's visits. We'll continue to monitor for now. Continue lisinopril hydrochlorothiazide and metoprolol.

## 2012-12-23 NOTE — Progress Notes (Signed)
Subjective:    Patient ID: Bailey Howard, female    DOB: 23-May-1946, 67 y.o.   MRN: [id number]  HPI 67 year old female with history of anal carcinoma, hypertension, hyperlipidemia , abdominal wall hernia status post repair presents for followup. In regards to her blood pressure, she reports blood pressure has been well-controlled at home typically 130s over 80s. She has been compliant with medication. She denies any headache, chest pain, palpitations.  She recently underwent followup colonoscopy in in may of 2014 which was normal. She reports that her general surgeon is planning to remove her chemotherapy port next month.  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 She was told to followup as needed. Next step would be knee replacement.  Outpatient Prescriptions Prior to Visit  Medication Sig Dispense Refill  . aspirin 81 MG EC tablet Take 81 mg by mouth daily.        . cholecalciferol (VITAMIN D) 1000 UNITS tablet Take 1,000 Units by mouth daily.      . Cranberry 650 MG CAPS Take 1 capsule by mouth daily. Dosage?       . Cyanocobalamin (VITAMIN B-12) 1000 MCG SUBL Place 2 tablets (2,000 mcg total) under the tongue daily.  60 tablet  6  . lisinopril-hydrochlorothiazide (PRINZIDE,ZESTORETIC) 20-25 MG per tablet Take 1 tablet by mouth daily.  90 tablet  3  . meloxicam (MOBIC) 15 MG tablet take 1 tablet by mouth once daily if needed  90 tablet  3  . metoprolol (LOPRESSOR) 50 MG tablet Take 1 tablet (50 mg total) by mouth 2 (two) times daily.  120 tablet  6  . Misc Natural Products (GLUCOSAMINE CHONDROITIN ADV PO) Take 1 tablet by mouth daily.        . Multiple Vitamins-Minerals (CENTRUM SILVER PO) Take 1 tablet by mouth daily.        . Omega-3 Fatty Acids (FISH OIL) 1200 MG CAPS Take 1 capsule by mouth daily.        . ondansetron (ZOFRAN) 8 MG tablet Take 8 mg by mouth 3 (three)  times daily.       . pantoprazole (PROTONIX) 40 MG tablet Take 1 tablet (40 mg total) by mouth daily.  30 tablet  6  . polyethylene glycol powder (GLYCOLAX/MIRALAX) powder 255 grams one bottle for colonoscopy prep  255 g  0  . simvastatin (ZOCOR) 10 MG tablet Take 1 tablet (10 mg total) by mouth at bedtime.  90 tablet  3  . traMADol (ULTRAM) 50 MG tablet Take 1 tablet (50 mg total) by mouth 2 (two) times daily as needed.  60 tablet  3  . vitamin C (ASCORBIC ACID) 500 MG tablet Take 500 mg by mouth daily.        . vitamin E 400 UNIT capsule Take 400 Units by mouth daily.        . promethazine (PHENERGAN) 12.5 MG tablet Take 1 tablet (12.5 mg total) by mouth every 6 (six) hours as needed for nausea.  60 tablet  0  . promethazine (PHENERGAN) 12.5 MG tablet Take 1 tablet (12.5 mg total) by mouth every 6 (six) hours as needed for nausea.  60 tablet  0  . promethazine (PHENERGAN) 12.5 MG tablet Take 12.5 mg by mouth every 6 (six) hours as needed.      . nitrofurantoin, macrocrystal-monohydrate, (MACROBID) 100 MG capsule Take 1 capsule (100 mg total) by mouth  2 (two) times daily.  14 capsule  0  . omeprazole (PRILOSEC) 40 MG capsule Take 40 mg by mouth daily.       No facility-administered medications prior to visit.    Review of Systems  Constitutional: Negative for fever, chills, appetite change, fatigue and unexpected weight change.  HENT: Negative for ear pain, congestion, sore throat, trouble swallowing, neck pain, voice change and sinus pressure.   Eyes: Negative for visual disturbance.  Respiratory: Negative for cough, shortness of breath, wheezing and stridor.   Cardiovascular: Negative for chest pain, palpitations and leg swelling.  Gastrointestinal: Negative for nausea, vomiting, abdominal pain, diarrhea, constipation, blood in stool, abdominal distention and anal bleeding.  Genitourinary: Negative for dysuria and flank pain.  Musculoskeletal: Negative for myalgias, arthralgias and gait  problem.  Skin: Negative for color change and rash.  Neurological: Negative for dizziness and headaches.  Hematological: Negative for adenopathy. Does not bruise/bleed easily.  Psychiatric/Behavioral: Negative for suicidal ideas, sleep disturbance and dysphoric mood. The patient is not nervous/anxious.        Objective:   Physical Exam  Constitutional: She is oriented to person, place, and time. She appears well-developed and well-nourished. No distress.  HENT:  Head: Normocephalic and atraumatic.  Right Ear: External ear normal.  Left Ear: External ear normal.  Nose: Nose normal.  Mouth/Throat: Oropharynx is clear and moist. No oropharyngeal exudate.  Eyes: Conjunctivae are normal. Pupils are equal, round, and reactive to light. Right eye exhibits no discharge. Left eye exhibits no discharge. No scleral icterus.  Neck: Normal range of motion. Neck supple. No tracheal deviation present. No thyromegaly present.  Cardiovascular: Normal rate, regular rhythm, normal heart sounds and intact distal pulses.  Exam reveals no gallop and no friction rub.   No murmur heard. Pulmonary/Chest: Effort normal and breath sounds normal. No accessory muscle usage. Not tachypneic. No respiratory distress. She has no decreased breath sounds. She has no wheezes. She has no rhonchi. She has no rales. She exhibits no tenderness.  Musculoskeletal: Normal range of motion. She exhibits no edema and no tenderness.  Lymphadenopathy:    She has no cervical adenopathy.  Neurological: She is alert and oriented to person, place, and time. No cranial nerve deficit. She exhibits normal muscle tone. Coordination normal.  Skin: Skin is warm and dry. No rash noted. She is not diaphoretic. No erythema. No pallor.  Psychiatric: She has a normal mood and affect. Her behavior is normal. Judgment and thought content normal.          Assessment & Plan:

## 2013-01-06 ENCOUNTER — Telehealth: Payer: Self-pay | Admitting: *Deleted

## 2013-01-06 NOTE — Telephone Encounter (Signed)
BP today was 148/60

## 2013-01-17 ENCOUNTER — Ambulatory Visit (INDEPENDENT_AMBULATORY_CARE_PROVIDER_SITE_OTHER): Payer: Medicare Other | Admitting: General Surgery

## 2013-01-17 ENCOUNTER — Encounter: Payer: Self-pay | Admitting: General Surgery

## 2013-01-17 VITALS — BP 160/80 | HR 68 | Resp 14 | Ht 65.0 in | Wt 191.0 lb

## 2013-01-17 DIAGNOSIS — Z86004 Personal history of in-situ neoplasm of other and unspecified digestive organs: Secondary | ICD-10-CM | POA: Insufficient documentation

## 2013-01-17 DIAGNOSIS — N63 Unspecified lump in unspecified breast: Secondary | ICD-10-CM

## 2013-01-17 DIAGNOSIS — Z85048 Personal history of other malignant neoplasm of rectum, rectosigmoid junction, and anus: Secondary | ICD-10-CM

## 2013-01-17 DIAGNOSIS — K219 Gastro-esophageal reflux disease without esophagitis: Secondary | ICD-10-CM

## 2013-01-17 NOTE — Patient Instructions (Addendum)
Patient to apply ice as needed.

## 2013-01-17 NOTE — Progress Notes (Signed)
Patient ID: Bailey Howard, female   DOB: [birth year], 67 y.o.   MRN: [id number]  Chief Complaint  Patient presents with  . Other    gerdand port removal    HPI Bailey Howard is a 67 y.o. female here today for gerd and port removal.No new problem.Reflex has improved since taking protonix. Biopsy of esophagus showed esophagitis with ulceration. No dysplastic changes.  HPI  Past Medical History  Diagnosis Date  . HLD (hyperlipidemia)   . Allergic rhinitis   . Insomnia   . Hernia 7-10    abdominal wall w/ incarcerated bowel, repaired by Dr Wyn Quaker, had wound vac X 4 months and skin graft  . Thoracic aortic aneurysm 2011    by ECHO, followed by Dr Mariah Milling  . Anemia     NOS  . Vitamin D deficiency 08-21-10  . HTN (hypertension) 2007  . 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 Breast lump 2013  . Hernia 2010  . High cholesterol     Past Surgical History  Procedure Laterality Date  . Vesicovaginal fistula closure w/ tah    . Bilateral leg stripping    . Oophorectomy      R  . Bladder tack      x2; last -10/04  . Open lapaotomy for bening colon mass  7/04  . Facial and neck skin cancer      basal cell  . Abdominal hernia repair  03/25/2011  . Colonoscopy  2013    Dr. Evette Cristal  . Port a cath revision  2013  . Cholecystectomy  2012  . Bladder surgery  2003,2006  . Foot surgery  1990  . Abdominal hysterectomy  1990    mass in ovary was benign, 1 ovary removed  . Tummy tuck   2010  . Colon surgery      anal carcinoma with lymph node mets completed radiation and chemotherapy    History reviewed. No pertinent family history.  Social History History  Substance Use Topics  . Smoking status: Former Smoker -- 0.50 packs/day for 5 years    Quit date: 01/14/2011  . Smokeless tobacco: Never Used  . Alcohol Use: No    Allergies  Allergen Reactions  . Morphine And Related   . Oxycodone     Current Outpatient Prescriptions  Medication Sig Dispense Refill  . aspirin 81 MG EC tablet  Take 81 mg by mouth daily.        . cholecalciferol (VITAMIN D) 1000 UNITS tablet Take 1,000 Units by mouth daily.      . ciprofloxacin (CIPRO) 250 MG tablet Take 1 tablet by mouth daily.      . Cranberry 650 MG CAPS Take 1 capsule by mouth daily. Dosage?       . Cyanocobalamin (VITAMIN B-12) 1000 MCG SUBL Place 2 tablets (2,000 mcg total) under the tongue daily.  60 tablet  6  . lisinopril-hydrochlorothiazide (PRINZIDE,ZESTORETIC) 20-25 MG per tablet Take 1 tablet by mouth daily.  90 tablet  3  . meloxicam (MOBIC) 15 MG tablet take 1 tablet by mouth once daily if needed  90 tablet  3  . metoprolol (LOPRESSOR) 50 MG tablet Take 1 tablet (50 mg total) by mouth 2 (two) times daily.  120 tablet  6  . Misc Natural Products (GLUCOSAMINE CHONDROITIN ADV PO) Take 1 tablet by mouth daily.        . Multiple Vitamins-Minerals (CENTRUM SILVER PO) Take 1 tablet by mouth daily.        Marland Kitchen  Omega-3 Fatty Acids (FISH OIL) 1200 MG CAPS Take 1 capsule by mouth daily.        . ondansetron (ZOFRAN) 8 MG tablet Take 8 mg by mouth 3 (three) times daily.       . pantoprazole (PROTONIX) 40 MG tablet Take 1 tablet (40 mg total) by mouth daily.  30 tablet  6  . polyethylene glycol powder (GLYCOLAX/MIRALAX) powder 255 grams one bottle for colonoscopy prep  255 g  0  . promethazine (PHENERGAN) 12.5 MG tablet Take 12.5 mg by mouth every 6 (six) hours as needed.      . simvastatin (ZOCOR) 10 MG tablet Take 1 tablet (10 mg total) by mouth at bedtime.  90 tablet  3  . traMADol (ULTRAM) 50 MG tablet Take 1 tablet (50 mg total) by mouth 2 (two) times daily as needed.  60 tablet  3  . vitamin C (ASCORBIC ACID) 500 MG tablet Take 500 mg by mouth daily.        . vitamin E 400 UNIT capsule Take 400 Units by mouth daily.        . promethazine (PHENERGAN) 12.5 MG tablet Take 1 tablet (12.5 mg total) by mouth every 6 (six) hours as needed for nausea.  60 tablet  0  . promethazine (PHENERGAN) 12.5 MG tablet Take 1 tablet (12.5 mg total) by  mouth every 6 (six) hours as needed for nausea.  60 tablet  0   No current facility-administered medications for this visit.    Review of Systems Review of Systems  Blood pressure 160/80, pulse 68, resp. rate 14, height 5\' 5"  (1.651 m), weight 191 lb (86.637 kg).  Physical Exam Physical Exam  Constitutional: Vital signs are normal. She appears well-developed and well-nourished.  Eyes: Conjunctivae are normal. No scleral icterus.  Neck: No tracheal deviation present. No mass and no thyromegaly present.  Abdominal: Soft. Bowel sounds are normal. There is no tenderness.    Data Reviewed nil  Assessment    Improved GERD. Port removed     Plan    F/u in 1 yr      Procedure: removal of port. Anesthetic: 1% xylocaine mid=xed with 0.5% marcaine 10ml.  After prepping with chloroprep and draping the port site in right upper chest, prior incision was opened. Capsule around port opened. Tethering sutures of the port removed. Port removed in entirety. Bleeding controlled with disposable cautery. Wound close with 3-0vicryl subcutaneous, 4-0 vicryl subcuticular. Steristeris, telfa and tegaderm placed.  Procedure well tolerated.  SANKAR,SEEPLAPUTHUR G 01/17/2013, 9:17 PM

## 2013-01-26 ENCOUNTER — Ambulatory Visit (INDEPENDENT_AMBULATORY_CARE_PROVIDER_SITE_OTHER): Payer: Medicare Other | Admitting: *Deleted

## 2013-01-26 DIAGNOSIS — E538 Deficiency of other specified B group vitamins: Secondary | ICD-10-CM

## 2013-01-26 MED ORDER — CYANOCOBALAMIN 1000 MCG/ML IJ SOLN
1000.0000 ug | Freq: Once | INTRAMUSCULAR | Status: AC
  Administered 2013-01-26: 1000 ug via INTRAMUSCULAR

## 2013-02-07 ENCOUNTER — Other Ambulatory Visit: Payer: Self-pay | Admitting: Internal Medicine

## 2013-02-11 ENCOUNTER — Other Ambulatory Visit: Payer: Self-pay | Admitting: Internal Medicine

## 2013-02-11 NOTE — Telephone Encounter (Signed)
Eprescribed.

## 2013-02-17 ENCOUNTER — Other Ambulatory Visit: Payer: Self-pay | Admitting: Internal Medicine

## 2013-03-02 ENCOUNTER — Ambulatory Visit: Payer: Medicare Other

## 2013-03-02 ENCOUNTER — Ambulatory Visit (INDEPENDENT_AMBULATORY_CARE_PROVIDER_SITE_OTHER): Payer: Medicare Other | Admitting: Adult Health

## 2013-03-02 ENCOUNTER — Encounter: Payer: Self-pay | Admitting: Adult Health

## 2013-03-02 ENCOUNTER — Encounter: Payer: Self-pay | Admitting: *Deleted

## 2013-03-02 VITALS — BP 130/74 | HR 110 | Temp 97.9°F | Resp 20 | Wt 193.0 lb

## 2013-03-02 DIAGNOSIS — E538 Deficiency of other specified B group vitamins: Secondary | ICD-10-CM

## 2013-03-02 DIAGNOSIS — R350 Frequency of micturition: Secondary | ICD-10-CM

## 2013-03-02 DIAGNOSIS — R300 Dysuria: Secondary | ICD-10-CM

## 2013-03-02 LAB — POCT URINALYSIS DIPSTICK
Nitrite, UA: POSITIVE
Protein, UA: NEGATIVE
Urobilinogen, UA: 0.2

## 2013-03-02 MED ORDER — CYANOCOBALAMIN 1000 MCG/ML IJ SOLN
1000.0000 ug | Freq: Once | INTRAMUSCULAR | Status: AC
  Administered 2013-03-02: 1000 ug via INTRAMUSCULAR

## 2013-03-02 MED ORDER — CIPROFLOXACIN HCL 500 MG PO TABS: 500.0000 mg | ORAL_TABLET | Freq: Two times a day (BID) | ORAL | Status: DC

## 2013-03-02 NOTE — Progress Notes (Signed)
Subjective:    Patient ID: Bailey Howard, female    DOB: 28-Aug-1945, 67 y.o.   MRN: [id number]  HPI  Patient is a pleasant 67 year old female who presents with c/o frequency, urgency, chills. Denies fever, hematuria, flank pain. Symptoms began within the past week. She has take Aleve for symptom relief.    Current Outpatient Prescriptions on File Prior to Visit  Medication Sig Dispense Refill  . aspirin 81 MG EC tablet Take 81 mg by mouth daily.        . cholecalciferol (VITAMIN D) 1000 UNITS tablet Take 1,000 Units by mouth daily.      . Cranberry 650 MG CAPS Take 1 capsule by mouth daily. Dosage?       . Cyanocobalamin (VITAMIN B-12) 1000 MCG SUBL Place 2 tablets (2,000 mcg total) under the tongue daily.  60 tablet  6  . lisinopril-hydrochlorothiazide (PRINZIDE,ZESTORETIC) 20-25 MG per tablet Take 1 tablet by mouth daily.  90 tablet  3  . meloxicam (MOBIC) 15 MG tablet take 1 tablet by mouth once daily if needed  90 tablet  1  . metoprolol (LOPRESSOR) 50 MG tablet take 1 tablet by mouth twice a day  120 tablet  3  . Misc Natural Products (GLUCOSAMINE CHONDROITIN ADV PO) Take 1 tablet by mouth daily.        . Multiple Vitamins-Minerals (CENTRUM SILVER PO) Take 1 tablet by mouth daily.        . Omega-3 Fatty Acids (FISH OIL) 1200 MG CAPS Take 1 capsule by mouth daily.        . ondansetron (ZOFRAN) 8 MG tablet Take 8 mg by mouth 3 (three) times daily.       . pantoprazole (PROTONIX) 40 MG tablet Take 1 tablet (40 mg total) by mouth daily.  30 tablet  6  . polyethylene glycol powder (GLYCOLAX/MIRALAX) powder 255 grams one bottle for colonoscopy prep  255 g  0  . promethazine (PHENERGAN) 12.5 MG tablet Take 12.5 mg by mouth every 6 (six) hours as needed.      . simvastatin (ZOCOR) 10 MG tablet take 1 tablet by mouth at bedtime  90 tablet  1  . traMADol (ULTRAM) 50 MG tablet Take 1 tablet (50 mg total) by mouth 2 (two) times daily as needed.  60 tablet  3  . vitamin C (ASCORBIC ACID) 500 MG  tablet Take 500 mg by mouth daily.        . vitamin E 400 UNIT capsule Take 400 Units by mouth daily.         No current facility-administered medications on file prior to visit.     Review of Systems  Constitutional: Positive for chills. Negative for fever.  Genitourinary: Positive for dysuria, urgency and frequency. Negative for hematuria, flank pain and pelvic pain.       Urine cloudy     BP 130/74  Pulse 110  Temp(Src) 97.9 F (36.6 C) (Oral)  Resp 20  Wt 193 lb (87.544 kg)  BMI 32.12 kg/m2  SpO2 95%    Objective:   Physical Exam  Constitutional: She is oriented to person, place, and time.  Overweight, pleasant female in no apparent  Cardiovascular: Regular rhythm.  Exam reveals no gallop and no friction rub.   No murmur heard. Tachycardia  Pulmonary/Chest: Effort normal and breath sounds normal. No respiratory distress. She has no wheezes. She has no rales.  Abdominal: Soft. Bowel sounds are normal. There is no tenderness.  Genitourinary:  No  suprapubic tenderness or costovertebral angle tenderness  Neurological: She is alert and oriented to person, place, and time.  Skin: Skin is warm and dry.  Psychiatric: She has a normal mood and affect. Her behavior is normal. Judgment and thought content normal.          Assessment & Plan:

## 2013-03-02 NOTE — Assessment & Plan Note (Signed)
UA dipstick shows possible UTI. Send urine for culture. Start Cipro 500 mg twice a day x5 days. Patient with history of recurrent urinary tract infections. RTC if symptoms are not improved within 3-4 days. May also take AZO or Uristat for urinary symptoms

## 2013-03-02 NOTE — Patient Instructions (Addendum)
Start Cipro 500 mg twice and day for 5 days.  Return to clinic if your symptoms are not improved within 3-4 days.  The urinary tract is the group of organs in the body that handle urine. The urinary tract includes the:   Kidneys, two bean-shaped organs that filter the blood to make urine  Bladder, a balloon-shaped organ that stores urine  Ureters, two tubes that carry urine from the kidneys to the bladder  Urethra, the tube that carries urine from the bladder to the outside of the body   What are urinary tract infections? - Urinary tract infections, also called "UTIs," are infections that affect either the bladder or the kidneys. Bladder infections are more common than kidney infections. Bladder infections happen when bacteria get into the urethra and travel up into the bladder. Kidney infections happen when the bacteria travel even higher, up into the kidneys. Both bladder and kidney infections are more common in women than men.   What are the symptoms of a bladder infection? - The symptoms include:   Pain or a burning feeling when you urinate  The need to urinate often  The need to urinate suddenly or in a hurry  Blood in the urine   What are the symptoms of a kidney infection? - The symptoms of a kidney infection can include the symptoms of a bladder infection, but kidney infections can also cause:   Fever  Back pain  Nausea or vomiting   How are urinary tract infections treated? - Most urinary tract infections are treated with antibiotic pills. These pills work by killing the germs that cause the infection.  If you have a bladder infection, you will probably need to take antibiotics for 3 to 7 days. If you have a kidney infection, you will probably need to take antibiotics for longer-maybe for up to 2 weeks. If you have a kidney infection, it's also possible you will need to be treated in the hospital.   Your symptoms should begin to improve within a day of starting antibiotics.  But you should finish all the antibiotic pills you get. Otherwise your infection might come back.  If needed, you can also take a medicine to numb your bladder such as AZO or Urostat which are sold over the counter.   Can cranberry juice or other cranberry products prevent bladder infections? - The studies suggesting that cranberry products prevent bladder infections are not very good. But if you want to try cranberry products for this purpose, there is probably not much harm in doing so.

## 2013-03-05 LAB — URINE CULTURE

## 2013-04-01 ENCOUNTER — Encounter: Payer: Self-pay | Admitting: Adult Health

## 2013-04-01 ENCOUNTER — Ambulatory Visit (INDEPENDENT_AMBULATORY_CARE_PROVIDER_SITE_OTHER): Payer: Medicare Other | Admitting: Adult Health

## 2013-04-01 ENCOUNTER — Other Ambulatory Visit: Payer: Self-pay | Admitting: Adult Health

## 2013-04-01 VITALS — BP 100/56 | HR 115 | Temp 98.0°F | Resp 24 | Wt 191.0 lb

## 2013-04-01 DIAGNOSIS — R8290 Unspecified abnormal findings in urine: Secondary | ICD-10-CM

## 2013-04-01 DIAGNOSIS — M545 Low back pain, unspecified: Secondary | ICD-10-CM

## 2013-04-01 DIAGNOSIS — R1110 Vomiting, unspecified: Secondary | ICD-10-CM | POA: Insufficient documentation

## 2013-04-01 DIAGNOSIS — R82998 Other abnormal findings in urine: Secondary | ICD-10-CM

## 2013-04-01 LAB — BASIC METABOLIC PANEL
BUN: 28 mg/dL — ABNORMAL HIGH (ref 6–23)
Calcium: 9.1 mg/dL (ref 8.4–10.5)
Creatinine, Ser: 1.8 mg/dL — ABNORMAL HIGH (ref 0.4–1.2)
GFR: 29.03 mL/min — ABNORMAL LOW (ref >60.00)

## 2013-04-01 LAB — POCT URINALYSIS DIPSTICK
Nitrite, UA: POSITIVE
Protein, UA: 100
Urobilinogen, UA: 0.2
pH, UA: 5

## 2013-04-01 LAB — CBC WITH DIFFERENTIAL/PLATELET
Eosinophils Absolute: 0 10*3/uL (ref 0.0–0.7)
Lymphs Abs: 0.6 10*3/uL — ABNORMAL LOW (ref 0.7–4.0)
MCHC: 33.8 g/dL (ref 30.0–36.0)
MCV: 89.4 fl (ref 78.0–100.0)
Monocytes Absolute: 0.7 10*3/uL (ref 0.1–1.0)
Neutrophils Relative %: 87.3 % — ABNORMAL HIGH (ref 43.0–77.0)
Platelets: 330 10*3/uL (ref 150.0–400.0)

## 2013-04-01 MED ORDER — CIPROFLOXACIN HCL 500 MG PO TABS: 500.0000 mg | ORAL_TABLET | Freq: Two times a day (BID) | ORAL | Status: DC

## 2013-04-01 NOTE — Progress Notes (Signed)
Pt notified of labs and urine results. Appointment scheduled for 04/04/13 for repeat labs.

## 2013-04-01 NOTE — Patient Instructions (Signed)
  Please have blood work done prior to leaving the office.  We also need to obtain a urine sample

## 2013-04-01 NOTE — Progress Notes (Signed)
Subjective:    Patient ID: Bailey Howard, female    DOB: January 10, 1946, 67 y.o.   MRN: [id number]  HPI Patient is a pleasant 67 y/o female who presents with the following symptoms: Vomited all night but does not remember how many times. Decreased appetite x 1 week.  Pain in low back. She reports having lower abdominal pain but this has resolved. Urine with foul odor. Denies fever, chills.   Current Outpatient Prescriptions on File Prior to Visit  Medication Sig Dispense Refill  . aspirin 81 MG EC tablet Take 81 mg by mouth daily.        . cholecalciferol (VITAMIN D) 1000 UNITS tablet Take 1,000 Units by mouth daily.      . ciprofloxacin (CIPRO) 500 MG tablet Take 1 tablet (500 mg total) by mouth 2 (two) times daily.  10 tablet  0  . Cranberry 650 MG CAPS Take 1 capsule by mouth daily. Dosage?       . Cyanocobalamin (VITAMIN B-12) 1000 MCG SUBL Place 2 tablets (2,000 mcg total) under the tongue daily.  60 tablet  6  . lisinopril-hydrochlorothiazide (PRINZIDE,ZESTORETIC) 20-25 MG per tablet Take 1 tablet by mouth daily.  90 tablet  3  . meloxicam (MOBIC) 15 MG tablet take 1 tablet by mouth once daily if needed  90 tablet  1  . metoprolol (LOPRESSOR) 50 MG tablet take 1 tablet by mouth twice a day  120 tablet  3  . Misc Natural Products (GLUCOSAMINE CHONDROITIN ADV PO) Take 1 tablet by mouth daily.        . Multiple Vitamins-Minerals (CENTRUM SILVER PO) Take 1 tablet by mouth daily.        . Omega-3 Fatty Acids (FISH OIL) 1200 MG CAPS Take 1 capsule by mouth daily.        . ondansetron (ZOFRAN) 8 MG tablet Take 8 mg by mouth 3 (three) times daily.       . pantoprazole (PROTONIX) 40 MG tablet Take 1 tablet (40 mg total) by mouth daily.  30 tablet  6  . polyethylene glycol powder (GLYCOLAX/MIRALAX) powder 255 grams one bottle for colonoscopy prep  255 g  0  . promethazine (PHENERGAN) 12.5 MG tablet Take 12.5 mg by mouth every 6 (six) hours as needed.      . simvastatin (ZOCOR) 10 MG tablet take 1  tablet by mouth at bedtime  90 tablet  1  . traMADol (ULTRAM) 50 MG tablet Take 1 tablet (50 mg total) by mouth 2 (two) times daily as needed.  60 tablet  3  . vitamin C (ASCORBIC ACID) 500 MG tablet Take 500 mg by mouth daily.        . vitamin E 400 UNIT capsule Take 400 Units by mouth daily.         No current facility-administered medications on file prior to visit.     Review of Systems  Constitutional: Negative for fever and chills.  Respiratory:       Shortness of breath on exertion  Cardiovascular: Negative.   Gastrointestinal: Positive for nausea, vomiting and diarrhea.       Diarrhea ongoing 2/2 radiation tx  Musculoskeletal: Positive for back pain.       Objective:   Physical Exam  Constitutional: She is oriented to person, place, and time.  67 y/o female appears that she is not feeling well.  Cardiovascular: Regular rhythm.   tachycardia  Pulmonary/Chest: Effort normal and breath sounds normal. No respiratory distress. She has  no wheezes. She has no rales.  Abdominal: Soft. She exhibits no distension. There is no tenderness. There is no rebound and no guarding.  Bowel sounds hyperactive  Genitourinary:  Suprapubic discomfort  Lymphadenopathy:    She has no cervical adenopathy.  Neurological: She is alert and oriented to person, place, and time.  Skin: Skin is warm and dry.  Psychiatric: She has a normal mood and affect. Her behavior is normal. Judgment and thought content normal.   BP 100/56  Pulse 115  Temp(Src) 98 F (36.7 C) (Oral)  Resp 24  Wt 191 lb (86.637 kg)  BMI 31.78 kg/m2  SpO2 96%     Assessment & Plan:

## 2013-04-01 NOTE — Assessment & Plan Note (Addendum)
Vomited several times during the night and c/o low back pain. Check cbc w/diff and bmet. Will also check urine. Patient has not been able to provide a sample. Suspect may have UTI given her overall symptoms. She will bring urine sample later this afternoon.

## 2013-04-04 ENCOUNTER — Other Ambulatory Visit (INDEPENDENT_AMBULATORY_CARE_PROVIDER_SITE_OTHER): Payer: Medicare Other

## 2013-04-04 DIAGNOSIS — Z0000 Encounter for general adult medical examination without abnormal findings: Secondary | ICD-10-CM

## 2013-04-04 DIAGNOSIS — E785 Hyperlipidemia, unspecified: Secondary | ICD-10-CM

## 2013-04-04 LAB — CBC WITH DIFFERENTIAL/PLATELET
Basophils Absolute: 0 10*3/uL (ref 0.0–0.1)
Eosinophils Relative: 1.1 % (ref 0.0–5.0)
Hemoglobin: 12.7 g/dL (ref 12.0–15.0)
Lymphocytes Relative: 10.3 % — ABNORMAL LOW (ref 12.0–46.0)
Monocytes Relative: 9.6 % (ref 3.0–12.0)
Neutro Abs: 4.9 10*3/uL (ref 1.4–7.7)
Platelets: 300 10*3/uL (ref 150.0–400.0)
RDW: 14.5 % (ref 11.5–14.6)
WBC: 6.2 10*3/uL (ref 4.5–10.5)

## 2013-04-04 LAB — COMPREHENSIVE METABOLIC PANEL
BUN: 19 mg/dL (ref 6–23)
CO2: 23 mEq/L (ref 19–32)
GFR: 53.35 mL/min — ABNORMAL LOW (ref >60.00)
Glucose, Bld: 92 mg/dL (ref 70–99)
Sodium: 132 mEq/L — ABNORMAL LOW (ref 135–145)
Total Bilirubin: 1.2 mg/dL (ref 0.3–1.2)
Total Protein: 6.2 g/dL (ref 6.0–8.3)

## 2013-04-04 LAB — LIPID PANEL
Cholesterol: 139 mg/dL (ref 0–200)
HDL: 40.7 mg/dL (ref >39.00)
VLDL: 59 mg/dL — ABNORMAL HIGH (ref 0.0–40.0)

## 2013-04-04 LAB — TSH: TSH: 3.09 u[IU]/mL (ref 0.35–5.50)

## 2013-04-05 ENCOUNTER — Ambulatory Visit (INDEPENDENT_AMBULATORY_CARE_PROVIDER_SITE_OTHER): Payer: Medicare Other | Admitting: *Deleted

## 2013-04-05 DIAGNOSIS — E538 Deficiency of other specified B group vitamins: Secondary | ICD-10-CM

## 2013-04-06 ENCOUNTER — Encounter: Payer: Self-pay | Admitting: *Deleted

## 2013-04-07 ENCOUNTER — Ambulatory Visit: Payer: Self-pay | Admitting: Oncology

## 2013-04-13 ENCOUNTER — Ambulatory Visit: Payer: Self-pay | Admitting: Oncology

## 2013-04-18 MED ORDER — CYANOCOBALAMIN 1000 MCG/ML IJ SOLN
1000.0000 ug | Freq: Once | INTRAMUSCULAR | Status: AC
  Administered 2013-04-05: 1000 ug via INTRAMUSCULAR

## 2013-05-06 ENCOUNTER — Ambulatory Visit (INDEPENDENT_AMBULATORY_CARE_PROVIDER_SITE_OTHER): Payer: Medicare Other | Admitting: *Deleted

## 2013-05-06 DIAGNOSIS — Z23 Encounter for immunization: Secondary | ICD-10-CM

## 2013-05-06 DIAGNOSIS — E538 Deficiency of other specified B group vitamins: Secondary | ICD-10-CM

## 2013-05-06 MED ORDER — CYANOCOBALAMIN 1000 MCG/ML IJ SOLN
1000.0000 ug | Freq: Once | INTRAMUSCULAR | Status: AC
  Administered 2013-05-06: 1000 ug via INTRAMUSCULAR

## 2013-05-14 ENCOUNTER — Ambulatory Visit: Payer: Self-pay | Admitting: Oncology

## 2013-06-08 ENCOUNTER — Ambulatory Visit (INDEPENDENT_AMBULATORY_CARE_PROVIDER_SITE_OTHER): Payer: Medicare Other | Admitting: *Deleted

## 2013-06-08 DIAGNOSIS — E538 Deficiency of other specified B group vitamins: Secondary | ICD-10-CM

## 2013-06-08 MED ORDER — CYANOCOBALAMIN 1000 MCG/ML IJ SOLN
1000.0000 ug | Freq: Once | INTRAMUSCULAR | Status: AC
  Administered 2013-06-08: 1000 ug via INTRAMUSCULAR

## 2013-06-29 ENCOUNTER — Encounter: Payer: Medicare Other | Admitting: Internal Medicine

## 2013-07-05 ENCOUNTER — Telehealth: Payer: Self-pay | Admitting: *Deleted

## 2013-07-05 ENCOUNTER — Encounter: Payer: Self-pay | Admitting: Internal Medicine

## 2013-07-05 ENCOUNTER — Ambulatory Visit (INDEPENDENT_AMBULATORY_CARE_PROVIDER_SITE_OTHER): Payer: Medicare Other | Admitting: Internal Medicine

## 2013-07-05 VITALS — BP 148/80 | HR 80 | Temp 98.1°F | Ht 64.0 in | Wt 196.0 lb

## 2013-07-05 DIAGNOSIS — E538 Deficiency of other specified B group vitamins: Secondary | ICD-10-CM

## 2013-07-05 DIAGNOSIS — K219 Gastro-esophageal reflux disease without esophagitis: Secondary | ICD-10-CM

## 2013-07-05 DIAGNOSIS — R0600 Dyspnea, unspecified: Secondary | ICD-10-CM | POA: Insufficient documentation

## 2013-07-05 DIAGNOSIS — R0609 Other forms of dyspnea: Secondary | ICD-10-CM

## 2013-07-05 DIAGNOSIS — Z0000 Encounter for general adult medical examination without abnormal findings: Secondary | ICD-10-CM | POA: Insufficient documentation

## 2013-07-05 MED ORDER — PANTOPRAZOLE SODIUM 40 MG PO TBEC: 40.0000 mg | DELAYED_RELEASE_TABLET | Freq: Two times a day (BID) | ORAL | Status: DC

## 2013-07-05 MED ORDER — CYANOCOBALAMIN 1000 MCG/ML IJ SOLN
1000.0000 ug | Freq: Once | INTRAMUSCULAR | Status: AC
  Administered 2013-07-05: 1000 ug via INTRAMUSCULAR

## 2013-07-05 NOTE — Telephone Encounter (Signed)
Notified pt that H.pylori kits came in and she said she will be coming in tomorrow around noon

## 2013-07-05 NOTE — Telephone Encounter (Signed)
Patient left a message stating she was told while at her visit today something would be here around 3:00. Not sure what the name of it was but wanted to make sure it was here before she came all the way down here. Not sure exactly what she is speaking of, did not leave a name of anything.

## 2013-07-05 NOTE — Assessment & Plan Note (Signed)
Recent increased symptoms of acid reflux with epigastric pain. No improvement with pantoprazole. Will send testing for H. pylori today. If negative and no improvement with twice-daily pantoprazole, would favor GI evaluation for a repeat upper endoscopy.

## 2013-07-05 NOTE — Telephone Encounter (Signed)
Bailey Jones, Do you have the supplies for H. Pylori testing? Can you let pt know.

## 2013-07-05 NOTE — Telephone Encounter (Signed)
I called the pt and told her i would call her when the H.pylori kit comes in

## 2013-07-05 NOTE — Progress Notes (Signed)
Subjective:    Patient ID: DAWNN NAM, female    DOB: 1946-05-13, 67 y.o.   MRN: [id number]  HPI The patient is here for annual Medicare wellness examination and management of other chronic and acute problems.   The risk factors are reflected in the social history.  The roster of all physicians providing medical care to patient - is listed in the Snapshot section of the chart.  Activities of daily living:  The patient is 100% independent in all ADLs: dressing, toileting, feeding as well as independent mobility.  Home safety : The patient does not have smoke detectors in the home. They wear seatbelts.  There are no firearms at home. There is no violence in the home.   There is no risks for hepatitis, STDs or HIV. There is a history of blood transfusion in the past. They have no travel history to infectious disease endemic areas of the world.  The patient has seen their dentist in the last six month. Dentist - Dr. Joesphine Bare They have not seen their eye doctor in the last year.   They have deferred audiologic testing in the last year.   They do not  have excessive sun exposure. Discussed the need for sun protection: hats, long sleeves and use of sunscreen if there is significant sun exposure.  Dermatologist - none Surgeon - Dr. Evette Cristal Oncologist - Dr. Orlie Dakin  Diet: the importance of a healthy diet is discussed. They do have a healthy diet.  The benefits of regular aerobic exercise were discussed. She does not exercise.  Depression screen: there are no signs or vegative symptoms of depression- irritability, change in appetite, anhedonia, sadness/tearfullness.  Cognitive assessment: the patient manages all their financial and personal affairs and is actively engaged. They could relate day,date,year and events.  The following portions of the patient's history were reviewed and updated as appropriate: allergies, current medications, past family history, past medical history,  past  surgical history, past social history  and problem list.  Visual acuity was not assessed per patient preference since she has regular follow up with her ophthalmologist. Hearing and body mass index were assessed and reviewed.   During the course of the visit the patient was educated and counseled about appropriate screening and preventive services including : fall prevention , diabetes screening, nutrition counseling, colorectal cancer screening, and recommended immunizations.    Pt is concerned today about progressive shortness of breath over last several months. Worse on exertion. Former smoker. Not currently taking any inhalers or other meds for dyspnea. No cough or chest pain. No palpitations.  Also concerned about increased symptoms of acid reflux with burning epigastric pain. No improvement with Pantoprazole. No nausea, vomiting, change in appetite.  Outpatient Encounter Prescriptions as of 07/05/2013  Medication Sig  . aspirin 81 MG EC tablet Take 81 mg by mouth daily.    . cholecalciferol (VITAMIN D) 1000 UNITS tablet Take 1,000 Units by mouth daily.  . Cranberry 650 MG CAPS Take 1 capsule by mouth daily. Dosage?   . Cyanocobalamin (VITAMIN B-12) 1000 MCG SUBL Place 2 tablets (2,000 mcg total) under the tongue daily.  Marland Kitchen lisinopril-hydrochlorothiazide (PRINZIDE,ZESTORETIC) 20-25 MG per tablet Take 1 tablet by mouth daily.  . meloxicam (MOBIC) 15 MG tablet take 1 tablet by mouth once daily if needed  . metoprolol (LOPRESSOR) 50 MG tablet take 1 tablet by mouth twice a day  . Misc Natural Products (GLUCOSAMINE CHONDROITIN ADV PO) Take 1 tablet by mouth daily.    Marland Kitchen  Multiple Vitamins-Minerals (CENTRUM SILVER PO) Take 1 tablet by mouth daily.    . Omega-3 Fatty Acids (FISH OIL) 1200 MG CAPS Take 1 capsule by mouth daily.    . polyethylene glycol powder (GLYCOLAX/MIRALAX) powder 255 grams one bottle for colonoscopy prep  . simvastatin (ZOCOR) 10 MG tablet take 1 tablet by mouth at bedtime  .  vitamin C (ASCORBIC ACID) 500 MG tablet Take 500 mg by mouth daily.    . vitamin E 400 UNIT capsule Take 400 Units by mouth daily.    . pantoprazole (PROTONIX) 40 MG tablet Take 1 tablet (40 mg total) by mouth 2 (two) times daily.  . traMADol (ULTRAM) 50 MG tablet Take 1 tablet (50 mg total) by mouth 2 (two) times daily as needed.   BP 148/80  Pulse 80  Temp(Src) 98.1 F (36.7 C) (Oral)  Ht 5\' 4"  (1.626 m)  Wt 196 lb (88.905 kg)  BMI 33.63 kg/m2  SpO2 97%  Review of Systems  Constitutional: Negative for fever, chills, appetite change, fatigue and unexpected weight change.  HENT: Negative for congestion, ear pain, sinus pressure, sore throat, trouble swallowing and voice change.   Eyes: Negative for visual disturbance.  Respiratory: Positive for shortness of breath. Negative for cough, wheezing and stridor.   Cardiovascular: Negative for chest pain, palpitations and leg swelling.  Gastrointestinal: Positive for abdominal pain. Negative for nausea, vomiting, diarrhea, constipation, blood in stool, abdominal distention and anal bleeding.  Genitourinary: Negative for dysuria and flank pain.  Musculoskeletal: Negative for arthralgias, gait problem, myalgias and neck pain.  Skin: Negative for color change and rash.  Neurological: Negative for dizziness and headaches.  Hematological: Negative for adenopathy. Does not bruise/bleed easily.  Psychiatric/Behavioral: Negative for suicidal ideas, sleep disturbance and dysphoric mood. The patient is not nervous/anxious.        Objective:   Physical Exam  Constitutional: She is oriented to person, place, and time. She appears well-developed and well-nourished. No distress.  HENT:  Head: Normocephalic and atraumatic.  Right Ear: External ear normal.  Left Ear: External ear normal.  Nose: Nose normal.  Mouth/Throat: Oropharynx is clear and moist. No oropharyngeal exudate.  Eyes: Conjunctivae are normal. Pupils are equal, round, and reactive to  light. Right eye exhibits no discharge. Left eye exhibits no discharge. No scleral icterus.  Neck: Normal range of motion. Neck supple. No tracheal deviation present. No thyromegaly present.  Cardiovascular: Normal rate, regular rhythm, normal heart sounds and intact distal pulses.  Exam reveals no gallop and no friction rub.   No murmur heard. Pulmonary/Chest: Breath sounds normal. No accessory muscle usage. Tachypnea noted. No respiratory distress. She has no decreased breath sounds. She has no wheezes. She has no rhonchi. She has no rales. She exhibits no tenderness.  Abdominal: Soft. Bowel sounds are normal. She exhibits no distension and no mass. There is no tenderness. There is no rebound and no guarding.  Musculoskeletal: Normal range of motion. She exhibits no edema and no tenderness.  Lymphadenopathy:    She has no cervical adenopathy.  Neurological: She is alert and oriented to person, place, and time. No cranial nerve deficit. She exhibits normal muscle tone. Coordination normal.  Skin: Skin is warm and dry. No rash noted. She is not diaphoretic. No erythema. No pallor.  Psychiatric: She has a normal mood and affect. Her behavior is normal. Judgment and thought content normal.          Assessment & Plan:

## 2013-07-05 NOTE — Assessment & Plan Note (Signed)
Recent increase in symptoms of shortness of breath over last few months.Exam is normal today except for mild tachypnea. No chest pain, palpitations. However, given her history of chemotherapy for rectal cancer, will get echocardiogram. Will also order a chest x-ray. Given her history of smoking, will set up pulmonary evaluation with PFTs. Follow up 2-4 weeks.

## 2013-07-05 NOTE — Progress Notes (Signed)
Pre-visit discussion using our clinic review tool. No additional management support is needed unless otherwise documented below in the visit note.  

## 2013-07-05 NOTE — Telephone Encounter (Signed)
Noted  

## 2013-07-05 NOTE — Assessment & Plan Note (Signed)
General medical exam normal today except as noted. Mammogram is up-to-date. Colonoscopy is up-to-date. Immunizations are up to date. Reviewed recent labs. Encouraged healthy diet and regular exercise as tolerated.

## 2013-07-06 ENCOUNTER — Other Ambulatory Visit: Payer: Medicare Other

## 2013-07-21 ENCOUNTER — Telehealth: Payer: Self-pay | Admitting: Emergency Medicine

## 2013-07-21 NOTE — Telephone Encounter (Signed)
Pt aware.

## 2013-07-21 NOTE — Telephone Encounter (Signed)
LVM for pt to make her aware pre-cert has not been approved through [place] for her echo. Apt has been cancelled until approved.

## 2013-07-22 ENCOUNTER — Other Ambulatory Visit: Payer: Medicare Other

## 2013-07-26 ENCOUNTER — Telehealth: Payer: Self-pay | Admitting: Emergency Medicine

## 2013-07-27 ENCOUNTER — Institutional Professional Consult (permissible substitution): Payer: Medicare Other | Admitting: Pulmonary Disease

## 2013-07-28 ENCOUNTER — Other Ambulatory Visit: Payer: Self-pay | Admitting: Internal Medicine

## 2013-07-28 NOTE — Telephone Encounter (Signed)
Refill

## 2013-08-02 ENCOUNTER — Telehealth: Payer: Self-pay | Admitting: Internal Medicine

## 2013-08-02 NOTE — Telephone Encounter (Signed)
Okay. If disoriented, then needs more urgent evaluation either in ED or urgent care tonight or by another provider tomorrow. May have urinary tract infection or other cause of confusion.

## 2013-08-02 NOTE — Telephone Encounter (Signed)
FYI please read below

## 2013-08-02 NOTE — Telephone Encounter (Signed)
States pt has appt later this week and wanted to inform Dr. Gilford Rile of some changes.  States pt in the last week is very disoriented.  She cannot remember things.  She has a dazed look in her eyes.  Fumbling around with things and cannot seem to find things.  Did not know if pt was on new medication or if it may be dementia.  Wanted Dr. Gilford Rile to be aware of the recent changes in pt status.

## 2013-08-03 ENCOUNTER — Telehealth: Payer: Self-pay | Admitting: Family Medicine

## 2013-08-03 ENCOUNTER — Inpatient Hospital Stay (HOSPITAL_COMMUNITY)
Admission: AD | Admit: 2013-08-03 | Discharge: 2013-08-06 | DRG: 054 | Disposition: A | Discharge status: Home Health | Payer: Medicare Other | Source: Other Acute Inpatient Hospital | Attending: Family Medicine | Admitting: Family Medicine

## 2013-08-03 ENCOUNTER — Emergency Department: Payer: Self-pay | Admitting: Emergency Medicine

## 2013-08-03 DIAGNOSIS — Z23 Encounter for immunization: Secondary | ICD-10-CM

## 2013-08-03 DIAGNOSIS — I10 Essential (primary) hypertension: Secondary | ICD-10-CM

## 2013-08-03 DIAGNOSIS — C7800 Secondary malignant neoplasm of unspecified lung: Secondary | ICD-10-CM | POA: Diagnosis present

## 2013-08-03 DIAGNOSIS — M1990 Unspecified osteoarthritis, unspecified site: Secondary | ICD-10-CM

## 2013-08-03 DIAGNOSIS — E785 Hyperlipidemia, unspecified: Secondary | ICD-10-CM

## 2013-08-03 DIAGNOSIS — C7949 Secondary malignant neoplasm of other parts of nervous system: Principal | ICD-10-CM

## 2013-08-03 DIAGNOSIS — Z79899 Other long term (current) drug therapy: Secondary | ICD-10-CM

## 2013-08-03 DIAGNOSIS — C210 Malignant neoplasm of anus, unspecified: Secondary | ICD-10-CM | POA: Diagnosis present

## 2013-08-03 DIAGNOSIS — G936 Cerebral edema: Secondary | ICD-10-CM | POA: Diagnosis present

## 2013-08-03 DIAGNOSIS — Z85048 Personal history of other malignant neoplasm of rectum, rectosigmoid junction, and anus: Secondary | ICD-10-CM

## 2013-08-03 DIAGNOSIS — N63 Unspecified lump in unspecified breast: Secondary | ICD-10-CM

## 2013-08-03 DIAGNOSIS — Z9221 Personal history of antineoplastic chemotherapy: Secondary | ICD-10-CM

## 2013-08-03 DIAGNOSIS — Z885 Allergy status to narcotic agent status: Secondary | ICD-10-CM

## 2013-08-03 DIAGNOSIS — H539 Unspecified visual disturbance: Secondary | ICD-10-CM | POA: Diagnosis present

## 2013-08-03 DIAGNOSIS — Z7982 Long term (current) use of aspirin: Secondary | ICD-10-CM

## 2013-08-03 DIAGNOSIS — I712 Thoracic aortic aneurysm, without rupture, unspecified: Secondary | ICD-10-CM

## 2013-08-03 DIAGNOSIS — I8390 Asymptomatic varicose veins of unspecified lower extremity: Secondary | ICD-10-CM

## 2013-08-03 DIAGNOSIS — C3490 Malignant neoplasm of unspecified part of unspecified bronchus or lung: Secondary | ICD-10-CM | POA: Diagnosis present

## 2013-08-03 DIAGNOSIS — E7800 Pure hypercholesterolemia, unspecified: Secondary | ICD-10-CM | POA: Diagnosis present

## 2013-08-03 DIAGNOSIS — Z86004 Personal history of in-situ neoplasm of other and unspecified digestive organs: Secondary | ICD-10-CM

## 2013-08-03 DIAGNOSIS — Z923 Personal history of irradiation: Secondary | ICD-10-CM

## 2013-08-03 DIAGNOSIS — C7931 Secondary malignant neoplasm of brain: Principal | ICD-10-CM | POA: Diagnosis present

## 2013-08-03 DIAGNOSIS — R17 Unspecified jaundice: Secondary | ICD-10-CM

## 2013-08-03 DIAGNOSIS — E538 Deficiency of other specified B group vitamins: Secondary | ICD-10-CM

## 2013-08-03 DIAGNOSIS — M1712 Unilateral primary osteoarthritis, left knee: Secondary | ICD-10-CM

## 2013-08-03 DIAGNOSIS — D649 Anemia, unspecified: Secondary | ICD-10-CM | POA: Diagnosis present

## 2013-08-03 DIAGNOSIS — E559 Vitamin D deficiency, unspecified: Secondary | ICD-10-CM

## 2013-08-03 DIAGNOSIS — R0600 Dyspnea, unspecified: Secondary | ICD-10-CM

## 2013-08-03 DIAGNOSIS — Z9089 Acquired absence of other organs: Secondary | ICD-10-CM

## 2013-08-03 DIAGNOSIS — G4700 Insomnia, unspecified: Secondary | ICD-10-CM | POA: Diagnosis present

## 2013-08-03 DIAGNOSIS — Z0000 Encounter for general adult medical examination without abnormal findings: Secondary | ICD-10-CM

## 2013-08-03 DIAGNOSIS — J309 Allergic rhinitis, unspecified: Secondary | ICD-10-CM

## 2013-08-03 DIAGNOSIS — K219 Gastro-esophageal reflux disease without esophagitis: Secondary | ICD-10-CM

## 2013-08-03 DIAGNOSIS — M549 Dorsalgia, unspecified: Secondary | ICD-10-CM

## 2013-08-03 DIAGNOSIS — Z87891 Personal history of nicotine dependence: Secondary | ICD-10-CM

## 2013-08-03 DIAGNOSIS — Z602 Problems related to living alone: Secondary | ICD-10-CM

## 2013-08-03 LAB — CBC WITH DIFFERENTIAL/PLATELET
BASOS ABS: 0 10*3/uL (ref 0.0–0.1)
BASOS PCT: 0.5 %
EOS ABS: 0.1 10*3/uL (ref 0.0–0.7)
EOS PCT: 0.7 %
HCT: 38.4 % (ref 35.0–47.0)
HGB: 12.8 g/dL (ref 12.0–16.0)
LYMPHS PCT: 7 %
Lymphocyte #: 0.5 10*3/uL — ABNORMAL LOW (ref 1.0–3.6)
MCH: 29.4 pg (ref 26.0–34.0)
MCHC: 33.3 g/dL (ref 32.0–36.0)
MCV: 88 fL (ref 80–100)
Monocyte #: 0.6 x10 3/mm (ref 0.2–0.9)
Monocyte %: 7.7 %
Neutrophil #: 6.2 10*3/uL (ref 1.4–6.5)
Neutrophil %: 84.1 %
Platelet: 368 10*3/uL (ref 150–440)
RBC: 4.34 10*6/uL (ref 3.80–5.20)
RDW: 14.9 % — ABNORMAL HIGH (ref 11.5–14.5)
WBC: 7.4 10*3/uL (ref 3.6–11.0)

## 2013-08-03 LAB — URINALYSIS, COMPLETE
BILIRUBIN, UR: NEGATIVE
Blood: NEGATIVE
Glucose,UR: NEGATIVE mg/dL (ref 0–75)
KETONE: NEGATIVE
Nitrite: NEGATIVE
PROTEIN: NEGATIVE
Ph: 6 (ref 4.5–8.0)
RBC,UR: NONE SEEN /HPF (ref 0–5)
SPECIFIC GRAVITY: 1.004 (ref 1.003–1.030)
Squamous Epithelial: <1
WBC UR: 9 /HPF (ref 0–5)

## 2013-08-03 LAB — COMPREHENSIVE METABOLIC PANEL
ALK PHOS: 122 U/L — AB
ALT: 19 U/L (ref 12–78)
ANION GAP: 5 — AB (ref 7–16)
Albumin: 3.8 g/dL (ref 3.4–5.0)
BILIRUBIN TOTAL: 1 mg/dL (ref 0.2–1.0)
BUN: 16 mg/dL (ref 7–18)
Calcium, Total: 9.8 mg/dL (ref 8.5–10.1)
Chloride: 103 mmol/L (ref 98–107)
Co2: 29 mmol/L (ref 21–32)
Creatinine: 1.36 mg/dL — ABNORMAL HIGH (ref 0.60–1.30)
EGFR (African American): 46 — ABNORMAL LOW
GFR CALC NON AF AMER: 39 — AB
Glucose: 92 mg/dL (ref 65–99)
OSMOLALITY: 275 (ref 275–301)
Potassium: 4.1 mmol/L (ref 3.5–5.1)
SGOT(AST): 17 U/L (ref 15–37)
SODIUM: 137 mmol/L (ref 136–145)
TOTAL PROTEIN: 7.7 g/dL (ref 6.4–8.2)

## 2013-08-03 LAB — TROPONIN I

## 2013-08-03 MED ORDER — PANTOPRAZOLE SODIUM 40 MG PO TBEC
40.0000 mg | DELAYED_RELEASE_TABLET | Freq: Two times a day (BID) | ORAL | Status: DC
  Administered 2013-08-04 – 2013-08-06 (×6): 40 mg via ORAL
  Filled 2013-08-03 (×5): qty 1

## 2013-08-03 MED ORDER — LISINOPRIL 20 MG PO TABS
20.0000 mg | ORAL_TABLET | Freq: Every day | ORAL | Status: DC
  Administered 2013-08-04 – 2013-08-06 (×3): 20 mg via ORAL
  Filled 2013-08-03 (×3): qty 1

## 2013-08-03 MED ORDER — HYDROCHLOROTHIAZIDE 25 MG PO TABS
25.0000 mg | ORAL_TABLET | Freq: Every day | ORAL | Status: DC
  Administered 2013-08-04 – 2013-08-06 (×3): 25 mg via ORAL
  Filled 2013-08-03 (×3): qty 1

## 2013-08-03 MED ORDER — MELOXICAM 15 MG PO TABS
15.0000 mg | ORAL_TABLET | Freq: Every day | ORAL | Status: DC
  Administered 2013-08-04 – 2013-08-05 (×2): 15 mg via ORAL
  Filled 2013-08-03 (×2): qty 1

## 2013-08-03 MED ORDER — METOPROLOL TARTRATE 50 MG PO TABS
50.0000 mg | ORAL_TABLET | Freq: Two times a day (BID) | ORAL | Status: DC
  Administered 2013-08-04 – 2013-08-06 (×6): 50 mg via ORAL
  Filled 2013-08-03 (×9): qty 1

## 2013-08-03 MED ORDER — DEXAMETHASONE SODIUM PHOSPHATE 10 MG/ML IJ SOLN
10.0000 mg | Freq: Once | INTRAMUSCULAR | Status: AC
  Administered 2013-08-04: 10 mg via INTRAVENOUS
  Filled 2013-08-03: qty 1

## 2013-08-03 MED ORDER — TRAMADOL HCL 50 MG PO TABS: 50.0000 mg | ORAL_TABLET | Freq: Two times a day (BID) | ORAL | Status: DC | PRN

## 2013-08-03 MED ORDER — LISINOPRIL-HYDROCHLOROTHIAZIDE 20-25 MG PO TABS: 1.0000 | ORAL_TABLET | Freq: Every day | ORAL | Status: DC

## 2013-08-03 MED ORDER — SIMVASTATIN 10 MG PO TABS
10.0000 mg | ORAL_TABLET | Freq: Every day | ORAL | Status: DC
  Administered 2013-08-04 – 2013-08-05 (×3): 10 mg via ORAL
  Filled 2013-08-03 (×6): qty 1

## 2013-08-03 MED ORDER — DEXAMETHASONE SODIUM PHOSPHATE 4 MG/ML IJ SOLN
4.0000 mg | Freq: Four times a day (QID) | INTRAMUSCULAR | Status: DC
  Administered 2013-08-04 – 2013-08-06 (×10): 4 mg via INTRAVENOUS
  Filled 2013-08-03 (×14): qty 1

## 2013-08-03 NOTE — Telephone Encounter (Signed)
Niece called again today, stating Ms. Locker is extremely confused and she does not think she needs to be operating a vehicle.

## 2013-08-03 NOTE — H&P (Signed)
[facility] History and Physical  Bailey Howard HYW:[id number] DOB: 07-12-1946 DOA: 08/03/2013  Referring physician: EDP PCP: Rica Mast, MD   Chief Complaint: AMS, visual disturbance   HPI: Bailey Howard is a 68 y.o. female with h/o anal cancer in 2013.  She presents to [facility] ED, with c/o floaters and per family 10 day history of increased confusion.  At baseline lives alone and able to care for self.  Family noticed patient went down the wrong street today and so brought in by family.  Work up in [facility] ED revealed new brain lesions on CT head in her L hemisphere "highly suspicious for metastatic disease" with "extensive" amount of surrounding vasogenic edema, they also note a hyper attenuating component of these lesions "which could represent dense cells or small volume hemorrhage".  Neurosurgery was called and requested patient to be transferred to [facility].  Review of Systems: Systems reviewed.  As above, otherwise negative  Past Medical History  Diagnosis Date  . HLD (hyperlipidemia)   . Allergic rhinitis   . Insomnia   . Hernia 7-10    abdominal wall w/ incarcerated bowel, repaired by Dr Lucky Cowboy, had wound vac X 4 months and skin graft  . Thoracic aortic aneurysm 2011    by ECHO, followed by Dr Rockey Situ  . Anemia     NOS  . Vitamin D deficiency 08-21-10  . HTN (hypertension) 2007  . 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 Breast lump 2013  . Hernia 2010  . High cholesterol    Past Surgical History  Procedure Laterality Date  . Vesicovaginal fistula closure w/ tah    . Bilateral leg stripping    . Oophorectomy      R  . Bladder tack      x2; last -10/04  . Open lapaotomy for bening colon mass  7/04  . Facial and neck skin cancer      basal cell  . Abdominal hernia repair  03/25/2011  . Colonoscopy  2013    Dr. Jamal Collin  . Port a cath revision  2013  . Cholecystectomy  2012  . Bladder surgery  2003,2006  . Foot surgery  1990  . Abdominal  hysterectomy  1990    mass in ovary was benign, 1 ovary removed  . Tummy tuck   2010  . Colon surgery      anal carcinoma with lymph node mets completed radiation and chemotherapy   Social History:  reports that she quit smoking about 2 years ago. She has never used smokeless tobacco. She reports that she does not drink alcohol or use illicit drugs.  Allergies  Allergen Reactions  . Morphine And Related Nausea And Vomiting  . Oxycodone Nausea And Vomiting    No family history on file.   Prior to Admission medications   Medication Sig Start Date End Date Taking? Authorizing Provider  aspirin 81 MG EC tablet Take 81 mg by mouth daily.     Yes Historical Provider, MD  cholecalciferol (VITAMIN D) 1000 UNITS tablet Take 1,000 Units by mouth daily.   Yes Historical Provider, MD  Cranberry 650 MG CAPS Take 650 mg by mouth daily.    Yes Historical Provider, MD  cyanocobalamin (,VITAMIN B-12,) 1000 MCG/ML injection Inject 1,000 mcg into the muscle every 30 (thirty) days.   Yes Historical Provider, MD  lisinopril-hydrochlorothiazide (PRINZIDE,ZESTORETIC) 20-25 MG per tablet Take 1 tablet by mouth daily.   Yes Historical Provider, MD  meloxicam (MOBIC) 15 MG tablet Take 15 mg by mouth daily.   Yes Historical Provider, MD  metoprolol (LOPRESSOR) 50 MG tablet Take 50 mg by mouth 2 (two) times daily.   Yes Historical Provider, MD  Misc Natural Products (GLUCOSAMINE CHONDROITIN ADV PO) Take 1 tablet by mouth daily.     Yes Historical Provider, MD  Multiple Vitamins-Minerals (CENTRUM SILVER PO) Take 1 tablet by mouth daily.     Yes Historical Provider, MD  Omega-3 Fatty Acids (FISH OIL) 1200 MG CAPS Take 1 capsule by mouth daily.     Yes Historical Provider, MD  pantoprazole (PROTONIX) 40 MG tablet Take 40 mg by mouth 2 (two) times daily.   Yes Historical Provider, MD  simvastatin (ZOCOR) 10 MG tablet Take 10 mg by mouth at bedtime.   Yes Historical Provider, MD  traMADol (ULTRAM) 50 MG tablet Take 50  mg by mouth every 12 (twelve) hours as needed for moderate pain.   Yes Historical Provider, MD  vitamin C (ASCORBIC ACID) 500 MG tablet Take 500 mg by mouth daily.     Yes Historical Provider, MD  vitamin E 400 UNIT capsule Take 400 Units by mouth daily.     Yes Historical Provider, MD   Physical Exam: There were no vitals filed for this visit.  There were no vitals taken for this visit.  General Appearance:    Alert, oriented, no distress, appears stated age  Head:    Normocephalic, atraumatic  Eyes:    PERRL, EOMI, sclera non-icteric        Nose:   Nares without drainage or epistaxis. Mucosa, turbinates normal  Throat:   Moist mucous membranes. Oropharynx without erythema or exudate.  Neck:   Supple. No carotid bruits.  No thyromegaly.  No lymphadenopathy.   Back:     No CVA tenderness, no spinal tenderness  Lungs:     Clear to auscultation bilaterally, without wheezes, rhonchi or rales  Chest wall:    No tenderness to palpitation  Heart:    Regular rate and rhythm without murmurs, gallops, rubs  Abdomen:     Soft, non-tender, nondistended, normal bowel sounds, no organomegaly  Genitalia:    deferred  Rectal:    deferred  Extremities:   No clubbing, cyanosis or edema.  Pulses:   2+ and symmetric all extremities  Skin:   Skin color, texture, turgor normal, no rashes or lesions  Lymph nodes:   Cervical, supraclavicular, and axillary nodes normal  Neurologic:   CNII-XII intact. Normal strength, sensation and reflexes      throughout    Labs on Admission:  Basic Metabolic Panel: No results found for this basename: NA, K, CL, CO2, GLUCOSE, BUN, CREATININE, CALCIUM, MG, PHOS,  in the last 168 hours Liver Function Tests: No results found for this basename: AST, ALT, ALKPHOS, BILITOT, PROT, ALBUMIN,  in the last 168 hours No results found for this basename: LIPASE, AMYLASE,  in the last 168 hours No results found for this basename: AMMONIA,  in the last 168 hours CBC: No results  found for this basename: WBC, NEUTROABS, HGB, HCT, MCV, PLT,  in the last 168 hours Cardiac Enzymes: No results found for this basename: CKTOTAL, CKMB, CKMBINDEX, TROPONINI,  in the last 168 hours  BNP (last 3 results) No results found for this basename: PROBNP,  in the last 8760 hours CBG: No results found for this basename: GLUCAP,  in the last 168 hours  Radiological Exams on Admission: No results found.  EKG: Independently reviewed.  Assessment/Plan Principal Problem:   Metastatic cancer to brain Active Problems:   Anal carcinoma   1. Metastatic cancer to brain - given history and appearance on CT most likely these brain lesions represent metastatic colo-rectal cancer.  Treating the extensive vasogenic edema with Decadron.  Needs neurosurgery consult in AM.    Code Status: Full  Family Communication: No family in room Disposition Plan: Admit to inpatient   Time spent: 70 min  Lalah Durango M. [facility] Pager [phone]  If 7AM-7PM, please contact the day team taking care of the patient [email or web address] Password [email or web address] 08/03/2013, 11:41 PM

## 2013-08-03 NOTE — Telephone Encounter (Signed)
When spoke to patient niece informed her of Dr. Thomes Dinning instructions. No openings in the office today, informed her to take patient to ED or urgent care as directed. She verbally agreed.

## 2013-08-03 NOTE — Telephone Encounter (Signed)
Received call from EDP at [facility] about with with some confusion.  Had head CT indicative of metastatic disease in setting of hx/o anal ca 3 years ago.  Neurosurgery consulted at [facility] with recommendation for medical admission and H-O consult.  Per EDP, NS w/ consult in am.  [facility]  Med-Surg bed.  Will need H-O consult on admission. M

## 2013-08-04 ENCOUNTER — Other Ambulatory Visit: Payer: Self-pay | Admitting: Radiation Therapy

## 2013-08-04 ENCOUNTER — Inpatient Hospital Stay (HOSPITAL_COMMUNITY): Payer: Medicare Other

## 2013-08-04 ENCOUNTER — Ambulatory Visit
Admit: 2013-08-04 | Discharge: 2013-08-04 | Disposition: A | Discharge status: Home / Self Care | Payer: Medicare Other | Attending: Radiation Oncology | Admitting: Radiation Oncology

## 2013-08-04 ENCOUNTER — Encounter (HOSPITAL_COMMUNITY): Payer: Self-pay | Admitting: *Deleted

## 2013-08-04 ENCOUNTER — Other Ambulatory Visit: Payer: Medicare Other

## 2013-08-04 ENCOUNTER — Encounter: Payer: Self-pay | Admitting: Radiation Oncology

## 2013-08-04 DIAGNOSIS — IMO0002 Reserved for concepts with insufficient information to code with codable children: Secondary | ICD-10-CM

## 2013-08-04 DIAGNOSIS — C7931 Secondary malignant neoplasm of brain: Secondary | ICD-10-CM

## 2013-08-04 DIAGNOSIS — C7949 Secondary malignant neoplasm of other parts of nervous system: Principal | ICD-10-CM

## 2013-08-04 DIAGNOSIS — R0989 Other specified symptoms and signs involving the circulatory and respiratory systems: Secondary | ICD-10-CM

## 2013-08-04 DIAGNOSIS — M549 Dorsalgia, unspecified: Secondary | ICD-10-CM

## 2013-08-04 DIAGNOSIS — M1710 Unilateral primary osteoarthritis, unspecified knee: Secondary | ICD-10-CM

## 2013-08-04 LAB — GLUCOSE, CAPILLARY
GLUCOSE-CAPILLARY: 106 mg/dL — AB (ref 70–99)
GLUCOSE-CAPILLARY: 137 mg/dL — AB (ref 70–99)
Glucose-Capillary: 110 mg/dL — ABNORMAL HIGH (ref 70–99)
Glucose-Capillary: 133 mg/dL — ABNORMAL HIGH (ref 70–99)

## 2013-08-04 MED ORDER — PNEUMOCOCCAL VAC POLYVALENT 25 MCG/0.5ML IJ INJ
0.5000 mL | INJECTION | INTRAMUSCULAR | Status: AC
  Administered 2013-08-05: 0.5 mL via INTRAMUSCULAR
  Filled 2013-08-04 (×3): qty 0.5

## 2013-08-04 MED ORDER — IOHEXOL 300 MG/ML  SOLN
25.0000 mL | INTRAMUSCULAR | Status: AC
  Administered 2013-08-04 (×2): 25 mL via ORAL

## 2013-08-04 MED ORDER — IOHEXOL 300 MG/ML  SOLN
100.0000 mL | Freq: Once | INTRAMUSCULAR | Status: AC | PRN
  Administered 2013-08-04: 100 mL via INTRAVENOUS

## 2013-08-04 MED ORDER — GADOBENATE DIMEGLUMINE 529 MG/ML IV SOLN
20.0000 mL | Freq: Once | INTRAVENOUS | Status: AC
  Administered 2013-08-04: 18 mL via INTRAVENOUS

## 2013-08-04 NOTE — Telephone Encounter (Signed)
Patient niece left voicemail giving [place] an update of patient's condition. Returned her call, let her know Dr. Gilford Rile had seen the notes and spoken with the physician. Patient was admitted last night and niece is on her way to visit her.

## 2013-08-04 NOTE — Consult Note (Signed)
Radiation Oncology         [phone]) [phone] ________________________________  Initial inpatient Consultation  Name: Bailey Howard MRN: [id number]  Date: 08/03/2013  DOB: 25-Feb-1946  PJ:KDTOIZ,[id number] AZBELL, MD  No ref. provider found   REFERRING PHYSICIAN: No ref. provider found  DIAGNOSIS: 68 yo woman with multiple brain mets and history of anal cancer.  HISTORY OF PRESENT ILLNESS::Bailey Howard is a 68 y.o. female who was treated for anal cancer in 2013 who now presents with at least 3 brain mets and lung mets.Marland Kitchen  PREVIOUS RADIATION THERAPY: Yes [facility] anal radiotherapy  PAST MEDICAL HISTORY:  has a past medical history of HLD (hyperlipidemia); Allergic rhinitis; Insomnia; Hernia (7-10); Thoracic aortic aneurysm (2011); Anemia; Vitamin D deficiency (08-21-10); HTN (hypertension) (2007); Osteoarthritis (2012); Breast lump (2013); Hernia (2010); and High cholesterol.    PAST SURGICAL HISTORY: Past Surgical History  Procedure Laterality Date  . Vesicovaginal fistula closure w/ tah    . Bilateral leg stripping    . Oophorectomy      R  . Bladder tack      x2; last -10/04  . Open lapaotomy for bening colon mass  7/04  . Facial and neck skin cancer      basal cell  . Abdominal hernia repair  03/25/2011  . Colonoscopy  2013    Dr. Jamal Collin  . Port a cath revision  2013  . Cholecystectomy  2012  . Bladder surgery  2003,2006  . Foot surgery  1990  . Abdominal hysterectomy  1990    mass in ovary was benign, 1 ovary removed  . Tummy tuck   2010  . Colon surgery      anal carcinoma with lymph node mets completed radiation and chemotherapy    FAMILY HISTORY: family history is not on file.  SOCIAL HISTORY:  reports that she quit smoking about 2 years ago. She has never used smokeless tobacco. She reports that she does not drink alcohol or use illicit drugs.  ALLERGIES: Morphine and related and Oxycodone  MEDICATIONS:  Current Facility-Administered Medications  Medication Dose Route  Frequency Provider Last Rate Last Dose  . dexamethasone (DECADRON) injection 4 mg  4 mg Intravenous Q6H Etta Quill, DO   4 mg at 08/04/13 1809  . hydrochlorothiazide (HYDRODIURIL) tablet 25 mg  25 mg Oral Daily Shanda Howells, MD   25 mg at 08/04/13 1010  . lisinopril (PRINIVIL,ZESTRIL) tablet 20 mg  20 mg Oral Daily Shanda Howells, MD   20 mg at 08/04/13 1010  . meloxicam (MOBIC) tablet 15 mg  15 mg Oral Daily Etta Quill, DO   15 mg at 08/04/13 1009  . metoprolol (LOPRESSOR) tablet 50 mg  50 mg Oral BID Etta Quill, DO   50 mg at 08/04/13 1010  . pantoprazole (PROTONIX) EC tablet 40 mg  40 mg Oral BID Etta Quill, DO   40 mg at 08/04/13 1009  . [START ON 08/05/2013] pneumococcal 23 valent vaccine (PNU-IMMUNE) injection 0.5 mL  0.5 mL Intramuscular Tomorrow-1000 Shanda Howells, MD      . simvastatin (ZOCOR) tablet 10 mg  10 mg Oral QHS Etta Quill, DO   10 mg at 08/04/13 0127  . traMADol (ULTRAM) tablet 50 mg  50 mg Oral Q12H PRN Etta Quill, DO        REVIEW OF SYSTEMS:  A 15 point review of systems is documented in the electronic medical record. This was obtained by the nursing staff. However,  I reviewed this with the patient to discuss relevant findings and make appropriate changes.  A comprehensive review of systems was negative except for: Constitutional: positive for visual disturbance.   PHYSICAL EXAM:  height is 5\' 7"  (1.702 m) and weight is 189 lb 13.1 oz (86.1 kg). Her oral temperature is 98.1 F (36.7 C). Her blood pressure is 145/79 and her pulse is 98. Her respiration is 18 and oxygen saturation is 97%.   per Dr. Wyline Copas General Appearance:  Alert, oriented, no distress, appears stated age  Head:  Normocephalic, atraumatic  Eyes:  PERRL, EOMI, sclera non-icteric  Nose:  Nares without drainage or epistaxis. Mucosa, turbinates normal  Throat:  Moist mucous membranes. Oropharynx without erythema or exudate.  Neck:  Supple. No carotid bruits. No thyromegaly. No  lymphadenopathy.  Back:  No CVA tenderness, no spinal tenderness  Lungs:  Clear to auscultation bilaterally, without wheezes, rhonchi or rales  Chest wall:  No tenderness to palpitation  Heart:  Regular rate and rhythm without murmurs, gallops, rubs  Abdomen:  Soft, non-tender, nondistended, normal bowel sounds, no organomegaly  Genitalia:  deferred  Rectal:  deferred  Extremities:  No clubbing, cyanosis or edema.  Pulses:  2+ and symmetric all extremities  Skin:  Skin color, texture, turgor normal, no rashes or lesions  Lymph nodes:  Cervical, supraclavicular, and axillary nodes normal  Neurologic: CNII-XII intact. Normal strength, sensation and reflexes  throughout    KPS = 70  100 - Normal; no complaints; no evidence of disease. 90   - Able to carry on normal activity; minor signs or symptoms of disease. 80   - Normal activity with effort; some signs or symptoms of disease. 70   - Cares for self; unable to carry on normal activity or to do active work. 60   - Requires occasional assistance, but is able to care for most of his personal needs. 50   - Requires considerable assistance and frequent medical care. 12   - Disabled; requires special care and assistance. 84   - Severely disabled; hospital admission is indicated although death not imminent. 57   - Very sick; hospital admission necessary; active supportive treatment necessary. 10   - Moribund; fatal processes progressing rapidly. 0     - Dead  Karnofsky DA, Abelmann [place], Craver LS and Burchenal [facility] [phone]) The use of the nitrogen mustards in the palliative treatment of carcinoma: with particular reference to bronchogenic carcinoma Cancer 1 [id number]  LABORATORY DATA:  Lab Results  Component Value Date   WBC 6.2 04/04/2013   HGB 12.7 04/04/2013   HCT 36.7 04/04/2013   MCV 89.1 04/04/2013   PLT 300.0 04/04/2013   Lab Results  Component Value Date   NA 132* 04/04/2013   K 4.7 04/04/2013   CL 101 04/04/2013   CO2 23  04/04/2013   Lab Results  Component Value Date   ALT 14 04/04/2013   AST 19 04/04/2013   ALKPHOS 74 04/04/2013   BILITOT 1.2 04/04/2013     RADIOGRAPHY: Ct Abdomen Pelvis W Wo Contrast  08/04/2013   CLINICAL DATA:  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 Restaging.  EXAM: CT CHEST, ABDOMEN, AND PELVIS WITH CONTRAST  TECHNIQUE: Multidetector CT imaging of the chest, abdomen and pelvis was performed following the standard protocol during bolus administration of intravenous contrast.  CONTRAST:  163mL OMNIPAQUE IOHEXOL 300 MG/ML  SOLN  COMPARISON:  NM PET TUM IMG RESTAG (PS) SKULL BASE T - THIGH dated 03/16/2012;  CT ANGIO CHEST W/CM &/OR WO/CM dated 08/06/2010; CT ABD-PELV W/O CM dated 04/29/2011  FINDINGS: CT CHEST FINDINGS  There are no enlarged mediastinal, hilar or axillary lymph nodes. There is diffuse atherosclerosis of the aorta, great vessels and coronary arteries. 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  There is trace pleural fluid on the right. There is no significant pericardial or left pleural effusion.  There has been interval development of multiple right-sided pulmonary nodules, many subpleural in location. Some of these extend along the fissures. There is a multilobulated right upper lobe nodule measure 1.9 cm on image 18. There is a 3.0 cm mass in the right lower lobe on image 47. Numerous other smaller nodules are present. No focal nodules are identified within the left lung.  CT ABDOMEN AND PELVIS FINDINGS  The liver appear stable without evidence of metastatic disease. There are stable bilateral adrenal nodules, measuring 1.4 cm on the right and 1.9 cm on the left. The spleen and pancreas appear normal. Small gallbladder laterally on image 68 or postoperative fluid collection is unchanged. There is no biliary dilatation.  Both kidneys demonstrate cortical thinning. There is a 1.2 cm  low-density lesion in the lower pole of the left kidney on image 67 which is grossly unchanged from the prior PET-CT. There is no hydronephrosis.  There is a small hiatal hernia. The stomach and small bowel appear unremarkable. Patient appears to be status post ileocolonic anastomosis. No focal colonic abnormalities are identified. There are stable postsurgical changes within the anterior abdominal wall below the umbilicus. No drainable fluid collection or recurrent hernia identified.  There is diffuse aortoiliac atherosclerosis. No enlarged abdominal pelvic lymph nodes are identified. There is no ascites or peritoneal nodularity. The ovaries appears stable status post partial hysterectomy. The bladder appears normal. There is likely a degree of pelvic floor relaxation.  There is diffuse thoracolumbar spondylosis. No osseous metastases are identified. Old rib fractures are present on the right.  IMPRESSION: 1. Interval development of multiple right-sided pulmonary/subpleural nodules highly suspicious for metastatic disease. The largest lesions within the right upper and lower lobes could represent primary bronchogenic carcinoma. 2. No evidence of abdominal pelvic metastatic disease. The visualized rectum appears stable. There is probable pelvic floor relaxation. 3. Stable small bilateral adrenal nodules. 4. Bilateral renal cortical thinning without hydronephrosis. 5. 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   Electronically Signed   By: Camie Patience M.D.   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 Restaging.  EXAM: CT CHEST, ABDOMEN, AND PELVIS WITH CONTRAST  TECHNIQUE: Multidetector CT imaging of the chest, abdomen and pelvis was performed following the standard protocol during bolus administration of intravenous contrast.  CONTRAST:  182mL OMNIPAQUE IOHEXOL 300 MG/ML  SOLN  COMPARISON:  NM PET TUM IMG RESTAG  (PS) SKULL BASE T - THIGH dated 03/16/2012; CT ANGIO CHEST W/CM &/OR WO/CM dated 08/06/2010; CT ABD-PELV W/O CM dated 04/29/2011  FINDINGS: CT CHEST FINDINGS  There are no enlarged mediastinal, hilar or axillary lymph nodes. There is diffuse atherosclerosis of the aorta, great vessels and coronary arteries. 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  There is trace pleural fluid on the right. There is no significant pericardial or  left pleural effusion.  There has been interval development of multiple right-sided pulmonary nodules, many subpleural in location. Some of these extend along the fissures. There is a multilobulated right upper lobe nodule measure 1.9 cm on image 18. There is a 3.0 cm mass in the right lower lobe on image 47. Numerous other smaller nodules are present. No focal nodules are identified within the left lung.  CT ABDOMEN AND PELVIS FINDINGS  The liver appear stable without evidence of metastatic disease. There are stable bilateral adrenal nodules, measuring 1.4 cm on the right and 1.9 cm on the left. The spleen and pancreas appear normal. Small gallbladder laterally on image 68 or postoperative fluid collection is unchanged. There is no biliary dilatation.  Both kidneys demonstrate cortical thinning. There is a 1.2 cm low-density lesion in the lower pole of the left kidney on image 67 which is grossly unchanged from the prior PET-CT. There is no hydronephrosis.  There is a small hiatal hernia. The stomach and small bowel appear unremarkable. Patient appears to be status post ileocolonic anastomosis. No focal colonic abnormalities are identified. There are stable postsurgical changes within the anterior abdominal wall below the umbilicus. No drainable fluid collection or recurrent hernia identified.  There is diffuse aortoiliac atherosclerosis. No enlarged abdominal pelvic lymph nodes are identified. There is  no ascites or peritoneal nodularity. The ovaries appears stable status post partial hysterectomy. The bladder appears normal. There is likely a degree of pelvic floor relaxation.  There is diffuse thoracolumbar spondylosis. No osseous metastases are identified. Old rib fractures are present on the right.  IMPRESSION: 1. Interval development of multiple right-sided pulmonary/subpleural nodules highly suspicious for metastatic disease. The largest lesions within the right upper and lower lobes could represent primary bronchogenic carcinoma. 2. No evidence of abdominal pelvic metastatic disease. The visualized rectum appears stable. There is probable pelvic floor relaxation. 3. Stable small bilateral adrenal nodules. 4. Bilateral renal cortical thinning without hydronephrosis. 5. 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   Electronically Signed   By: Camie Patience M.D.   On: 08/04/2013 16:01   Mr Jeri Cos SA Contrast  08/04/2013   CLINICAL DATA:  Visual changes and speech difficulty. History of anorectal cancer with multiple lesions seen on recent head CT.  EXAM: MRI HEAD WITHOUT AND WITH CONTRAST  TECHNIQUE: Multiplanar, multiecho pulse sequences of the brain and surrounding structures were obtained without and with intravenous contrast.  CONTRAST:  8mL MULTIHANCE GADOBENATE DIMEGLUMINE 529 MG/ML IV SOLN  COMPARISON:  Head CT 06/13/2014  FINDINGS: Heterogeneously enhancing left frontal mass measures 4.1 x 3.8 cm with mild surrounding vasogenic edema. A smaller lesion more anteriorly and inferiorly in the left frontal lobe measures 1 cm with small amount of surrounding edema. There is an enhancing lesion in the left occipital region which appears extra-axial and is invading/eroding the occipital bone. This lesion is difficult to separate from adjacent brain parenchyma and difficult to measure, however on axial T2 images there is the suggestion of a lesion measuring 5.5 x 2.7 cm (series 11, image  6). The lesion appears to invade the proximal portion of the left transverse sinus, and there is extensive enhancement and edema throughout the left occipital lobe, with much of the enhancement appearing gyriform. There is a small amount of intrinsic T1 hyperintensity within the left occipital lobe suggestive of hemorrhage. Edema extends up into the left parietal lobe, and there is also a small amount of edema and enhancement in the superior left cerebellum.  There are patchy areas of restricted diffusion within the left occipital lobe. There is abnormal enhancement and thickening of the left tentorium superiorly.  There is no midline shift. There is mild mass effect on the posterior aspect of the left lateral ventricle. There is mild generalized cerebral atrophy. Small, scattered foci of T2 hyperintensity within the subcortical and deep cerebral white matter are nonspecific but compatible with minimal chronic small vessel ischemic disease. Orbits are unremarkable. There is mild left-greater-than-right maxillary sinus mucosal thickening. Mastoid air cells are clear.  IMPRESSION: Two enhancing lesions in the left frontal lobe and an extra-axial (likely dural) lesion in the left occipital region, concerning for metastases. The left occipital lesion appears to invade the left transverse sinus, with edema and enhancement in the left parieto-occipital region and superior left cerebellum most concerning for venous infarcts.  These results were called by telephone at the time of interpretation on 08/04/2013 at 5:00 PM to Dr. Kary Kos , who verbally acknowledged these results.   Electronically Signed   By: Logan Bores   On: 08/04/2013 17:01      IMPRESSION: 68 yo woman with at least 3 brain metastases presumably from previously treated anal cancer.  The diffuse infiltrative nature of her left occipital lesion which invades into brain and crosses the tentorium does not appear amenable to radiosurgery.  Whole brain  radiotherapy may be a better approach in order to adequately cover that diffuse lesion as well as the others.  The dominant frontal lesion may be amenable to resection by neurosurgery to confirm tissue diagnosis and relieve vasogenic edema.  But, the risks versus benefit of surgical resection in this 68 yo woman with widespread disease is a difficult decision.  Regardless of possible surgery whole brain radiotherapy will likely be beneficial and may be offered closer to her [facility] at [facility] after discharge.  PLAN:Today, I talked to the patient and family about the findings and work-up thus far.  We discussed the natural history of brain mets and general treatment, highlighting the role or radiotherapy in the management.  We discussed the available radiation techniques, and focused on the details of logistics and delivery.  We reviewed the anticipated acute and late sequelae associated with radiation in this setting.  The patient was encouraged to ask questions that I answered to the best of my ability.   Once she is stable for discharge, I think a referral to rad-onc at [facility] for whole brain radiotherapy would be appropriate.  I spent 60 minutes minutes face to face with the patient and more than 50% of that time was spent in counseling and/or coordination of care.    ------------------------------------------------  Sheral Apley. Tammi Klippel, M.D.

## 2013-08-04 NOTE — Progress Notes (Signed)
[facility] PROGRESS NOTE  Bailey Howard ION:[id number] DOB: December 22, 1945 DOA: 08/03/2013 PCP: Rica Mast, MD  Assessment/Plan: 1. Metastatic cancer to brain  - given history and appearance on CT most likely these brain lesions represent metastatic colo - rectal cancer - Treating the extensive vasogenic edema on Decadron. - Neurosurgery consulted - Recs for full metastatic survey - Rec for oncology consult  Code Status: Full Family Communication: Pt in room (indicate person spoken with, relationship, and if by phone, the number) Disposition Plan: Pending  Consultants:  Neurosurgery  HPI/Subjective: No acute events noted overnight  Objective: Filed Vitals:   08/03/13 2210 08/04/13 0431  BP: 141/48 145/79  Pulse: 92 98  Temp: 98.3 F (36.8 C) 98.1 F (36.7 C)  TempSrc: Oral Oral  Resp: 18 18  Height: 5\' 7"  (1.702 m)   Weight: 86.1 kg (189 lb 13.1 oz)   SpO2: 95% 97%   No intake or output data in the 24 hours ending 08/04/13 0800 Filed Weights   08/03/13 2210  Weight: 86.1 kg (189 lb 13.1 oz)    Exam:  General:  Awake, in nad  Cardiovascular: regular, s1, s2  Respiratory: normal resp effort, no wheezing  Abdomen: soft, nondistended  Musculoskeletal: perfused, no clubbing   Data Reviewed: Basic Metabolic Panel: No results found for this basename: NA, K, CL, CO2, GLUCOSE, BUN, CREATININE, CALCIUM, MG, PHOS,  in the last 168 hours Liver Function Tests: No results found for this basename: AST, ALT, ALKPHOS, BILITOT, PROT, ALBUMIN,  in the last 168 hours No results found for this basename: LIPASE, AMYLASE,  in the last 168 hours No results found for this basename: AMMONIA,  in the last 168 hours CBC: No results found for this basename: WBC, NEUTROABS, HGB, HCT, MCV, PLT,  in the last 168 hours Cardiac Enzymes: No results found for this basename: CKTOTAL, CKMB, CKMBINDEX, TROPONINI,  in the last 168 hours BNP (last 3 results) No results found  for this basename: PROBNP,  in the last 8760 hours CBG:  Recent Labs Lab 08/04/13 0549  GLUCAP 106*    No results found for this or any previous visit (from the past 240 hour(s)).   Studies: No results found.  Scheduled Meds: . dexamethasone  4 mg Intravenous Q6H  . hydrochlorothiazide  25 mg Oral Daily  . lisinopril  20 mg Oral Daily  . meloxicam  15 mg Oral Daily  . metoprolol  50 mg Oral BID  . pantoprazole  40 mg Oral BID  . [START ON 08/05/2013] pneumococcal 23 valent vaccine  0.5 mL Intramuscular Tomorrow-1000  . simvastatin  10 mg Oral QHS   Continuous Infusions:   Principal Problem:   Metastatic cancer to brain Active Problems:   Anal carcinoma  Time spent: 53min  Bailey Howard, [facility] Pager [phone]. If 7PM-7AM, please contact night-coverage at [email or web address], password [facility] 08/04/2013, 8:00 AM  LOS: 1 day

## 2013-08-04 NOTE — Progress Notes (Signed)
  Radiation Oncology         [phone] ________________________________  Name: Bailey Howard MRN: [id number]  Date: 08/03/2013  DOB: 18-Jul-1945  Chart Note:  I reviewed this patient's most recent findings and wanted to take a minute to document my impression.  I will see her in consultation later today.  She is a very nice 68 yo woman with a history of anal cancer who now presents with CT showing likely brain mets.  I have not been able to access CT images.  Brain MRI has been suggested by neurosurgery.  Plan:  At this point, the patient is set up to proceed with brain MRI and probably start steroids.  Optimal management of brain mets will be more easily determined after MRI.  ________________________________  Sheral Apley. Tammi Klippel, M.D.

## 2013-08-04 NOTE — Consult Note (Signed)
Reason for Consult: Brain mass Referring Physician: [facility]  BOBBYE PETTI is an 68 y.o. female.  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 Agent had no known metastatic disease present in the ER elements last night with a two-week history of seeing floaters in spots and the family reporting that she has some difficulty with her speech that she had noticed. She denies any weakness that she's noticed denies any stumbling her gait abdomen on the that she's noticed she has noted some cramping in both legs. She denies any  nausea or vomiting, some blurriness revision but otherwise nonfocal.  Past Medical History  Diagnosis Date  . HLD (hyperlipidemia)   . Allergic rhinitis   . Insomnia   . Hernia 7-10    abdominal wall w/ incarcerated bowel, repaired by Dr Lucky Cowboy, had wound vac X 4 months and skin graft  . Thoracic aortic aneurysm 2011    by ECHO, followed by Dr Rockey Situ  . Anemia     NOS  . Vitamin D deficiency 08-21-10  . HTN (hypertension) 2007  . 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 Breast lump 2013  . Hernia 2010  . High cholesterol     Past Surgical History  Procedure Laterality Date  . Vesicovaginal fistula closure w/ tah    . Bilateral leg stripping    . Oophorectomy      R  . Bladder tack      x2; last -10/04  . Open lapaotomy for bening colon mass  7/04  . Facial and neck skin cancer      basal cell  . Abdominal hernia repair  03/25/2011  . Colonoscopy  2013    Dr. Jamal Collin  . Port a cath revision  2013  . Cholecystectomy  2012  . Bladder surgery  2003,2006  . Foot surgery  1990  . Abdominal hysterectomy  1990    mass in ovary was benign, 1 ovary removed  . Tummy tuck   2010  . Colon surgery      anal carcinoma with lymph node mets completed radiation and chemotherapy    No family history on  file.  Social History:  reports that she quit smoking about 2 years ago. She has never used smokeless tobacco. She reports that she does not drink alcohol or use illicit drugs.  Allergies:  Allergies  Allergen Reactions  . Morphine And Related Nausea And Vomiting  . Oxycodone Nausea And Vomiting    Medications: I have reviewed the patient's current medications.  Results for orders placed during the hospital encounter of 08/03/13 (from the past 48 hour(s))  GLUCOSE, CAPILLARY     Status: Abnormal   Collection Time    08/04/13  5:49 AM      Result Value Range   Glucose-Capillary 106 (*) 70 - 99 mg/dL    No results found.  Review of Systems  Constitutional: Negative.   Eyes: Positive for blurred vision.  Respiratory: Negative.   Cardiovascular: Negative.   Gastrointestinal: Negative.   Genitourinary: Negative.   Musculoskeletal: Positive for myalgias.  Skin: Negative.   Neurological: Positive for dizziness.  Endo/Heme/Allergies: Negative.   Psychiatric/Behavioral: Negative.    Blood pressure 145/79, pulse 98, temperature 98.1 F (36.7 C), temperature source Oral, resp. rate 18, height 5\' 7"  (1.702 m), weight 86.1 kg (189 lb 13.1 oz), SpO2  97.00%. Physical Exam  Constitutional: She is oriented to person, place, and time. She appears well-developed and well-nourished.  HENT:  Head: Normocephalic.  Eyes: Pupils are equal, round, and reactive to light.  Neck: Normal range of motion.  Cardiovascular: Normal rate.   Respiratory: Effort normal and breath sounds normal.  GI: Soft. Bowel sounds are normal.  Musculoskeletal: Normal range of motion.  Neurological: She is alert and oriented to person, place, and time. Coordination abnormal. GCS eye subscore is 4. GCS verbal subscore is 5. GCS motor subscore is 6.  Reflex Scores:      Tricep reflexes are 2+ on the right side and 2+ on the left side.      Bicep reflexes are 2+ on the right side and 2+ on the left side.       Brachioradialis reflexes are 2+ on the right side and 2+ on the left side.      Patellar reflexes are 2+ on the right side and 2+ on the left side.      Achilles reflexes are 2+ on the right side and 2+ on the left side. Patient is awake alert and oriented x4 pupils are equal and reactive to light extraocular movements are intact and cranial nerves appear to be intact except there appears to be some dysarthria strength appears to be 5 out of 5 in her left upper and lower extremity right extremity appears to have some subtle weakness at 4+ out of 5 with a slight pronator drift lower extremity seems to be 5 out of 5  Skin: Skin is warm and dry.    Assessment/Plan: 67 year old female presents elements of the two-week history of seeing floaters and some dysarthria CT scan showed what looks like at least to significant masses left frontal left posterior parietal with extensive vasogenic edema consistent with metastatic disease. Patient has no known history of metastatic disease however does have a known history of anorectal cancer patient was admitted to try the hospitalist service will undergo full metastatic workup recommend CT scan chest abdomen pelvis and an MRI scan of her brain with and without contrast we should do the MRI with the [facility] protocol for possible surgery should it be indicated needed. Recommend both medical and radiation oncology consults.  Deaun Rocha P 08/04/2013, 7:45 AM

## 2013-08-05 ENCOUNTER — Inpatient Hospital Stay (HOSPITAL_COMMUNITY): Payer: Medicare Other

## 2013-08-05 DIAGNOSIS — Z87898 Personal history of other specified conditions: Secondary | ICD-10-CM

## 2013-08-05 DIAGNOSIS — K219 Gastro-esophageal reflux disease without esophagitis: Secondary | ICD-10-CM

## 2013-08-05 DIAGNOSIS — M1990 Unspecified osteoarthritis, unspecified site: Secondary | ICD-10-CM

## 2013-08-05 DIAGNOSIS — I10 Essential (primary) hypertension: Secondary | ICD-10-CM

## 2013-08-05 LAB — GLUCOSE, CAPILLARY
GLUCOSE-CAPILLARY: 121 mg/dL — AB (ref 70–99)
Glucose-Capillary: 127 mg/dL — ABNORMAL HIGH (ref 70–99)
Glucose-Capillary: 158 mg/dL — ABNORMAL HIGH (ref 70–99)

## 2013-08-05 MED ORDER — DIPHENOXYLATE-ATROPINE 2.5-0.025 MG PO TABS: 1.0000 | ORAL_TABLET | Freq: Four times a day (QID) | ORAL | Status: DC | PRN

## 2013-08-05 NOTE — Progress Notes (Signed)
Nurse reported that patient had some red blood seen on bottom after a BM.  Asked patient to call if any more blood seen so I can take a look with the nurse.  The nurse reported to me that she did not see a hemorrhoid but some abrased skin from her frequent diarrhea which has subsequently slowed down and nearly resolved.  Pt had a colonoscopy in May 2014 reported as no significant findings per patient.  I could not find the actual procedure report in [facility] as patient saw a surgeon in [place] area.  Will follow.  Also repeat CBC ordered for AM.  Murvin Natal, MD

## 2013-08-05 NOTE — Progress Notes (Signed)
Utilization review completed. Jasaun Carn, RN, BSN. 

## 2013-08-05 NOTE — Progress Notes (Signed)
Subjective: Patient reports that she feels better floaters are virtually gone no headache no nausea no vomiting  Objective: Vital signs in last 24 hours: Temp:  [98.2 F (36.8 C)-98.6 F (37 C)] 98.6 F (37 C) (01/23 1427) Pulse Rate:  [78-90] 81 (01/23 1427) Resp:  [16-18] 16 (01/23 1427) BP: (102-142)/(52-66) 102/52 mmHg (01/23 1427) SpO2:  [98 %] 98 % (01/23 1427)  Intake/Output from previous day: 01/22 0701 - 01/23 0700 In: 120 [P.O.:120] Out: 350 [Urine:350] Intake/Output this shift:    Awake alert oriented speech improved strength 5 out of 5 with only a slight right pronator drift  Lab Results: No results found for this basename: WBC, HGB, HCT, PLT,  in the last 72 hours BMET No results found for this basename: NA, K, CL, CO2, GLUCOSE, BUN, CREATININE, CALCIUM,  in the last 72 hours  Studies/Results: Ct Abdomen Pelvis W Wo Contrast  08/04/2013   CLINICAL DATA:  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 Restaging.  EXAM: CT CHEST, ABDOMEN, AND PELVIS WITH CONTRAST  TECHNIQUE: Multidetector CT imaging of the chest, abdomen and pelvis was performed following the standard protocol during bolus administration of intravenous contrast.  CONTRAST:  139mL OMNIPAQUE IOHEXOL 300 MG/ML  SOLN  COMPARISON:  NM PET TUM IMG RESTAG (PS) SKULL BASE T - THIGH dated 03/16/2012; CT ANGIO CHEST W/CM &/OR WO/CM dated 08/06/2010; CT ABD-PELV W/O CM dated 04/29/2011  FINDINGS: CT CHEST FINDINGS  There are no enlarged mediastinal, hilar or axillary lymph nodes. There is diffuse atherosclerosis of the aorta, great vessels and coronary arteries. 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  There is trace pleural fluid on the right. There is no significant pericardial or left pleural effusion.  There has been interval development of multiple right-sided pulmonary nodules, many subpleural in location. Some of these  extend along the fissures. There is a multilobulated right upper lobe nodule measure 1.9 cm on image 18. There is a 3.0 cm mass in the right lower lobe on image 47. Numerous other smaller nodules are present. No focal nodules are identified within the left lung.  CT ABDOMEN AND PELVIS FINDINGS  The liver appear stable without evidence of metastatic disease. There are stable bilateral adrenal nodules, measuring 1.4 cm on the right and 1.9 cm on the left. The spleen and pancreas appear normal. Small gallbladder laterally on image 68 or postoperative fluid collection is unchanged. There is no biliary dilatation.  Both kidneys demonstrate cortical thinning. There is a 1.2 cm low-density lesion in the lower pole of the left kidney on image 67 which is grossly unchanged from the prior PET-CT. There is no hydronephrosis.  There is a small hiatal hernia. The stomach and small bowel appear unremarkable. Patient appears to be status post ileocolonic anastomosis. No focal colonic abnormalities are identified. There are stable postsurgical changes within the anterior abdominal wall below the umbilicus. No drainable fluid collection or recurrent hernia identified.  There is diffuse aortoiliac atherosclerosis. No enlarged abdominal pelvic lymph nodes are identified. There is no ascites or peritoneal nodularity. The ovaries appears stable status post partial hysterectomy. The bladder appears normal. There is likely a degree of pelvic floor relaxation.  There is diffuse thoracolumbar spondylosis. No osseous metastases are identified. Old rib fractures are present on the right.  IMPRESSION: 1. Interval development of multiple right-sided pulmonary/subpleural nodules highly suspicious for metastatic disease. The largest lesions within the  right upper and lower lobes could represent primary bronchogenic carcinoma. 2. No evidence of abdominal pelvic metastatic disease. The visualized rectum appears stable. There is probable pelvic  floor relaxation. 3. Stable small bilateral adrenal nodules. 4. Bilateral renal cortical thinning without hydronephrosis. 5. 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   Electronically Signed   By: Camie Patience M.D.   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 Restaging.  EXAM: CT CHEST, ABDOMEN, AND PELVIS WITH CONTRAST  TECHNIQUE: Multidetector CT imaging of the chest, abdomen and pelvis was performed following the standard protocol during bolus administration of intravenous contrast.  CONTRAST:  132mL OMNIPAQUE IOHEXOL 300 MG/ML  SOLN  COMPARISON:  NM PET TUM IMG RESTAG (PS) SKULL BASE T - THIGH dated 03/16/2012; CT ANGIO CHEST W/CM &/OR WO/CM dated 08/06/2010; CT ABD-PELV W/O CM dated 04/29/2011  FINDINGS: CT CHEST FINDINGS  There are no enlarged mediastinal, hilar or axillary lymph nodes. There is diffuse atherosclerosis of the aorta, great vessels and coronary arteries. 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  There is trace pleural fluid on the right. There is no significant pericardial or left pleural effusion.  There has been interval development of multiple right-sided pulmonary nodules, many subpleural in location. Some of these extend along the fissures. There is a multilobulated right upper lobe nodule measure 1.9 cm on image 18. There is a 3.0 cm mass in the right lower lobe on image 47. Numerous other smaller nodules are present. No focal nodules are identified within the left lung.  CT ABDOMEN AND PELVIS FINDINGS  The liver appear stable without evidence of metastatic disease. There are stable bilateral adrenal nodules, measuring 1.4 cm on the right and 1.9 cm on the left. The spleen and pancreas appear normal. Small gallbladder laterally on image 68 or postoperative fluid collection is unchanged. There is no  biliary dilatation.  Both kidneys demonstrate cortical thinning. There is a 1.2 cm low-density lesion in the lower pole of the left kidney on image 67 which is grossly unchanged from the prior PET-CT. There is no hydronephrosis.  There is a small hiatal hernia. The stomach and small bowel appear unremarkable. Patient appears to be status post ileocolonic anastomosis. No focal colonic abnormalities are identified. There are stable postsurgical changes within the anterior abdominal wall below the umbilicus. No drainable fluid collection or recurrent hernia identified.  There is diffuse aortoiliac atherosclerosis. No enlarged abdominal pelvic lymph nodes are identified. There is no ascites or peritoneal nodularity. The ovaries appears stable status post partial hysterectomy. The bladder appears normal. There is likely a degree of pelvic floor relaxation.  There is diffuse thoracolumbar spondylosis. No osseous metastases are identified. Old rib fractures are present on the right.  IMPRESSION: 1. Interval development of multiple right-sided pulmonary/subpleural nodules highly suspicious for metastatic disease. The largest lesions within the right upper and lower lobes could represent primary bronchogenic carcinoma. 2. No evidence of abdominal pelvic metastatic disease. The visualized rectum appears stable. There is probable pelvic floor relaxation. 3. Stable small bilateral adrenal nodules. 4. Bilateral renal cortical thinning without hydronephrosis. 5. 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   Electronically Signed   By: Camie Patience M.D.   On: 08/04/2013 16:01   Mr Jeri Cos KW Contrast  08/04/2013   CLINICAL DATA:  Visual changes and speech difficulty. History of anorectal cancer with multiple lesions seen on recent head CT.  EXAM: MRI HEAD WITHOUT AND WITH CONTRAST  TECHNIQUE: Multiplanar, multiecho pulse sequences of the brain and surrounding structures were obtained without and with  intravenous contrast.  CONTRAST:  74mL MULTIHANCE GADOBENATE DIMEGLUMINE 529 MG/ML IV SOLN  COMPARISON:  Head CT 06/13/2014  FINDINGS: Heterogeneously enhancing left frontal mass measures 4.1 x 3.8 cm with mild surrounding vasogenic edema. A smaller lesion more anteriorly and inferiorly in the left frontal lobe measures 1 cm with small amount of surrounding edema. There is an enhancing lesion in the left occipital region which appears extra-axial and is invading/eroding the occipital bone. This lesion is difficult to separate from adjacent brain parenchyma and difficult to measure, however on axial T2 images there is the suggestion of a lesion measuring 5.5 x 2.7 cm (series 11, image 6). The lesion appears to invade the proximal portion of the left transverse sinus, and there is extensive enhancement and edema throughout the left occipital lobe, with much of the enhancement appearing gyriform. There is a small amount of intrinsic T1 hyperintensity within the left occipital lobe suggestive of hemorrhage. Edema extends up into the left parietal lobe, and there is also a small amount of edema and enhancement in the superior left cerebellum. There are patchy areas of restricted diffusion within the left occipital lobe. There is abnormal enhancement and thickening of the left tentorium superiorly.  There is no midline shift. There is mild mass effect on the posterior aspect of the left lateral ventricle. There is mild generalized cerebral atrophy. Small, scattered foci of T2 hyperintensity within the subcortical and deep cerebral white matter are nonspecific but compatible with minimal chronic small vessel ischemic disease. Orbits are unremarkable. There is mild left-greater-than-right maxillary sinus mucosal thickening. Mastoid air cells are clear.  IMPRESSION: Two enhancing lesions in the left frontal lobe and an extra-axial (likely dural) lesion in the left occipital region, concerning for metastases. The left  occipital lesion appears to invade the left transverse sinus, with edema and enhancement in the left parieto-occipital region and superior left cerebellum most concerning for venous infarcts.  These results were called by telephone at the time of interpretation on 08/04/2013 at 5:00 PM to Dr. Kary Kos , who verbally acknowledged these results.   Electronically Signed   By: Logan Bores   On: 08/04/2013 17:01   Mr Mrv Head Wo Cm  08/05/2013   CLINICAL DATA:  Visual disturbance. Speech difficulty. Metastatic disease. Possible left transverse sinus invasion on the previous exam.  EXAM: MR MRV HEAD WITHOUT CONTRAST  TECHNIQUE: Multiplanar, multisequence MR imaging was performed. No intravenous contrast was administered.  COMPARISON:  08/04/2013  FINDINGS: Sagittal sinus appears normal in its proximal extent. There is slight narrowing of the distal sagittal sinus just proximal to the torcula. I think this is due to a combination of extrinsic compression from adjacent tumor and some amount of invasive tumor within the sinus at this level. Flow does remain patent, filling a large right transverse sinus and sigmoid sinus. The right jugular vein appears widely patent.  Left transverse sinus appears completely occluded due to tumor invasion. Cortical veins do reconstitute flow in the sigmoid sinus region with some flow in the left jugular vein.  Flow is present within the internal cerebral veins and the proximal straight sinus, but the distal straight sinus appears compressed or invaded such that flow from the deep system does not progress in the  normal direction. .  IMPRESSION: No flow on the left transverse sinus, secondary to tumor invasion or obliteration. Reconstituted flow on the left sigmoid sinus because of cortical veins entering in that region.  Constriction of the distal sagittal sinus just proximal to the torcula. I think there is probably some extrinsic compression and possibly some actual tumor invasion at  this location. Flow on the right does remain patent.  Constriction and/ or invasion of the distal straight sinus.   Electronically Signed   By: Nelson Chimes M.D.   On: 08/05/2013 10:37    Assessment/Plan: 68 year old female presents with multiple brain metastases and CT scans of her abdomen and pelvis showing multiple additional lesions throughout especially in her lung. In the setting of widely metastatic disease I do not feel that craniotomy and resection of any of these lesions is in this patient's best interest. I would recommend CT guided tissue diagnosis to confirm either primary bronchogenic carcinoma versus metastatic colorectal carcinoma and subsequent whole brain radiation. I have extensively reviewed this with the patient she agrees and we'll proceed forward with medical workup and subsequent nonsurgical management of her brain metastasis. We are available as needed for followup  LOS: 2 days     Nguyet Mercer P 08/05/2013, 3:41 PM

## 2013-08-05 NOTE — Progress Notes (Signed)
Assessed abraded area to patients anal region which still showed some bleeding however appears to have slowed since applying barrier cream.  Reapplied barrier cream and asked patient to do the same each time she goes to the bathroom.  I instructed patient if this area continues to bleed or if the bleeding gets worse she needs to notify night nurse to which she agreed.

## 2013-08-05 NOTE — Progress Notes (Addendum)
[facility] PROGRESS NOTE  PEGEEN Howard Bailey Howard:[id number] DOB: [birth year] DOA: 08/03/2013 PCP: Rica Mast, MD  Assessment/Plan: 1. 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  - Neurosurgery consulted  - Recs for full metastatic survey  - radiation oncology consulted - will call for medical oncology consultation:  I spoke with Dr. Benay Spice, also, I contacted pt's primary oncology team at [facility].  Dr. Sloan Leiter (rad onc) says he will see patient Monday at 1:30pm. He says he will get her in contact with her medical oncologist Dr. Grayland Ormond (out of office today)  and also arrange for tissue biopsy and proceed with radiation treatments, etc.    Code Status: Full  Family Communication: Niece at bedside Disposition Plan: Pending   Consultants:  Neurosurgery Rad oncology Medical oncology  HPI/Subjective: Pt reports that she feels better today.  Less slurred speech and less visual changes.   Objective: Filed Vitals:   08/05/13 0600  BP: 121/56  Pulse: 78  Temp: 98.2 F (36.8 C)  Resp: 18   No intake or output data in the 24 hours ending 08/05/13 1131 Filed Weights   08/03/13 2210  Weight: 189 lb 13.1 oz (86.1 kg)   Exam:  General: Awake, in nad Cardiovascular: regular, s1, s2  Respiratory: normal resp effort, no wheezing  Abdomen: soft, nondistended  Musculoskeletal: perfused, no clubbing  Neuro: nonfocal   Data Reviewed: Basic Metabolic Panel: No results found for this basename: NA, K, CL, CO2, GLUCOSE, BUN, CREATININE, CALCIUM, MG, PHOS,  in the last 168 hours Liver Function Tests: No results found for this basename: AST, ALT, ALKPHOS, BILITOT, PROT, ALBUMIN,  in the last 168 hours No results found for  this basename: LIPASE, AMYLASE,  in the last 168 hours No results found for this basename: AMMONIA,  in the last 168 hours CBC: No results found for this basename: WBC, NEUTROABS, HGB, HCT, MCV, PLT,  in the last 168 hours Cardiac Enzymes: No results found for this basename: CKTOTAL, CKMB, CKMBINDEX, TROPONINI,  in the last 168 hours BNP (last 3 results) No results found for this basename: PROBNP,  in the last 8760 hours CBG:  Recent Labs Lab 08/04/13 0549 08/04/13 1217 08/04/13 1741 08/04/13 2351 08/05/13 0603  GLUCAP 106* 137* 110* 133* 127*    No results found for this or any previous visit (from the past 240 hour(s)).   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 Restaging.  EXAM: CT CHEST, ABDOMEN, AND PELVIS WITH CONTRAST  TECHNIQUE: Multidetector CT imaging of the chest, abdomen and pelvis was performed following the standard protocol during bolus administration of intravenous contrast.  CONTRAST:  145mL OMNIPAQUE IOHEXOL 300 MG/ML  SOLN  COMPARISON:  NM PET TUM IMG RESTAG (PS) SKULL BASE T - THIGH dated 03/16/2012; CT ANGIO CHEST W/CM &/OR WO/CM dated 08/06/2010; CT ABD-PELV W/O CM dated 04/29/2011  FINDINGS: CT CHEST FINDINGS  There are no enlarged mediastinal, hilar or axillary lymph nodes. There is diffuse atherosclerosis of the aorta, great vessels and coronary arteries. 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  There is trace pleural fluid on  the right. There is no significant pericardial or left pleural effusion.  There has been interval development of multiple right-sided pulmonary nodules, many subpleural in location. Some of these extend along the fissures. There is a multilobulated right upper lobe nodule measure 1.9 cm on image 18. There is a 3.0 cm mass in the right lower lobe on image 47. Numerous other  smaller nodules are present. No focal nodules are identified within the left lung.  CT ABDOMEN AND PELVIS FINDINGS  The liver appear stable without evidence of metastatic disease. There are stable bilateral adrenal nodules, measuring 1.4 cm on the right and 1.9 cm on the left. The spleen and pancreas appear normal. Small gallbladder laterally on image 68 or postoperative fluid collection is unchanged. There is no biliary dilatation.  Both kidneys demonstrate cortical thinning. There is a 1.2 cm low-density lesion in the lower pole of the left kidney on image 67 which is grossly unchanged from the prior PET-CT. There is no hydronephrosis.  There is a small hiatal hernia. The stomach and small bowel appear unremarkable. Patient appears to be status post ileocolonic anastomosis. No focal colonic abnormalities are identified. There are stable postsurgical changes within the anterior abdominal wall below the umbilicus. No drainable fluid collection or recurrent hernia identified.  There is diffuse aortoiliac atherosclerosis. No enlarged abdominal pelvic lymph nodes are identified. There is no ascites or peritoneal nodularity. The ovaries appears stable status post partial hysterectomy. The bladder appears normal. There is likely a degree of pelvic floor relaxation.  There is diffuse thoracolumbar spondylosis. No osseous metastases are identified. Old rib fractures are present on the right.  IMPRESSION: 1. Interval development of multiple right-sided pulmonary/subpleural nodules highly suspicious for metastatic disease. The largest lesions within the right upper and lower lobes could represent primary bronchogenic carcinoma. 2. No evidence of abdominal pelvic metastatic disease. The visualized rectum appears stable. There is probable pelvic floor relaxation. 3. Stable small bilateral adrenal nodules. 4. Bilateral renal cortical thinning without hydronephrosis. 5. 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   Electronically Signed   By: Camie Patience M.D.   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 Restaging.  EXAM: CT CHEST, ABDOMEN, AND PELVIS WITH CONTRAST  TECHNIQUE: Multidetector CT imaging of the chest, abdomen and pelvis was performed following the standard protocol during bolus administration of intravenous contrast.  CONTRAST:  156mL OMNIPAQUE IOHEXOL 300 MG/ML  SOLN  COMPARISON:  NM PET TUM IMG RESTAG (PS) SKULL BASE T - THIGH dated 03/16/2012; CT ANGIO CHEST W/CM &/OR WO/CM dated 08/06/2010; CT ABD-PELV W/O CM dated 04/29/2011  FINDINGS: CT CHEST FINDINGS  There are no enlarged mediastinal, hilar or axillary lymph nodes. There is diffuse atherosclerosis of the aorta, great vessels and coronary arteries. 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  There is trace pleural fluid on the right. There is no significant pericardial or left pleural effusion.  There has been interval development of multiple right-sided pulmonary nodules, many subpleural in location. Some of these extend along the fissures. There is a multilobulated right upper lobe nodule measure 1.9 cm on image 18. There is a 3.0 cm mass in the right lower lobe on image 47. Numerous other smaller nodules are present. No focal nodules are identified within the left lung.  CT ABDOMEN AND  PELVIS FINDINGS  The liver appear stable without evidence of metastatic disease. There are stable bilateral adrenal nodules, measuring 1.4 cm on the right and 1.9 cm on the left. The spleen and pancreas appear normal. Small gallbladder laterally on image 68 or postoperative fluid collection is unchanged. There is no biliary dilatation.  Both kidneys demonstrate cortical thinning. There is a 1.2 cm low-density lesion in the lower pole of the left kidney on image 67 which is grossly unchanged  from the prior PET-CT. There is no hydronephrosis.  There is a small hiatal hernia. The stomach and small bowel appear unremarkable. Patient appears to be status post ileocolonic anastomosis. No focal colonic abnormalities are identified. There are stable postsurgical changes within the anterior abdominal wall below the umbilicus. No drainable fluid collection or recurrent hernia identified.  There is diffuse aortoiliac atherosclerosis. No enlarged abdominal pelvic lymph nodes are identified. There is no ascites or peritoneal nodularity. The ovaries appears stable status post partial hysterectomy. The bladder appears normal. There is likely a degree of pelvic floor relaxation.  There is diffuse thoracolumbar spondylosis. No osseous metastases are identified. Old rib fractures are present on the right.  IMPRESSION: 1. Interval development of multiple right-sided pulmonary/subpleural nodules highly suspicious for metastatic disease. The largest lesions within the right upper and lower lobes could represent primary bronchogenic carcinoma. 2. No evidence of abdominal pelvic metastatic disease. The visualized rectum appears stable. There is probable pelvic floor relaxation. 3. Stable small bilateral adrenal nodules. 4. Bilateral renal cortical thinning without hydronephrosis. 5. 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   Electronically Signed   By: Camie Patience M.D.   On: 08/04/2013 16:01   Mr Jeri Cos ER Contrast  08/04/2013   CLINICAL DATA:  Visual changes and speech difficulty. History of anorectal cancer with multiple lesions seen on recent head CT.  EXAM: MRI HEAD WITHOUT AND WITH CONTRAST  TECHNIQUE: Multiplanar, multiecho pulse sequences of the brain and surrounding structures were obtained without and with intravenous contrast.  CONTRAST:  20mL MULTIHANCE GADOBENATE DIMEGLUMINE 529 MG/ML IV SOLN  COMPARISON:  Head CT 06/13/2014  FINDINGS: Heterogeneously enhancing left frontal mass measures  4.1 x 3.8 cm with mild surrounding vasogenic edema. A smaller lesion more anteriorly and inferiorly in the left frontal lobe measures 1 cm with small amount of surrounding edema. There is an enhancing lesion in the left occipital region which appears extra-axial and is invading/eroding the occipital bone. This lesion is difficult to separate from adjacent brain parenchyma and difficult to measure, however on axial T2 images there is the suggestion of a lesion measuring 5.5 x 2.7 cm (series 11, image 6). The lesion appears to invade the proximal portion of the left transverse sinus, and there is extensive enhancement and edema throughout the left occipital lobe, with much of the enhancement appearing gyriform. There is a small amount of intrinsic T1 hyperintensity within the left occipital lobe suggestive of hemorrhage. Edema extends up into the left parietal lobe, and there is also a small amount of edema and enhancement in the superior left cerebellum. There are patchy areas of restricted diffusion within the left occipital lobe. There is abnormal enhancement and thickening of the left tentorium superiorly.  There is no midline shift. There is mild mass effect on the posterior aspect of the left lateral ventricle. There is mild generalized cerebral atrophy. Small, scattered foci of T2 hyperintensity within the subcortical and deep cerebral white matter are nonspecific but compatible with minimal chronic small vessel  ischemic disease. Orbits are unremarkable. There is mild left-greater-than-right maxillary sinus mucosal thickening. Mastoid air cells are clear.  IMPRESSION: Two enhancing lesions in the left frontal lobe and an extra-axial (likely dural) lesion in the left occipital region, concerning for metastases. The left occipital lesion appears to invade the left transverse sinus, with edema and enhancement in the left parieto-occipital region and superior left cerebellum most concerning for venous infarcts.   These results were called by telephone at the time of interpretation on 08/04/2013 at 5:00 PM to Dr. Kary Kos , who verbally acknowledged these results.   Electronically Signed   By: Logan Bores   On: 08/04/2013 17:01   Mr Mrv Head Wo Cm  08/05/2013   CLINICAL DATA:  Visual disturbance. Speech difficulty. Metastatic disease. Possible left transverse sinus invasion on the previous exam.  EXAM: MR MRV HEAD WITHOUT CONTRAST  TECHNIQUE: Multiplanar, multisequence MR imaging was performed. No intravenous contrast was administered.  COMPARISON:  08/04/2013  FINDINGS: Sagittal sinus appears normal in its proximal extent. There is slight narrowing of the distal sagittal sinus just proximal to the torcula. I think this is due to a combination of extrinsic compression from adjacent tumor and some amount of invasive tumor within the sinus at this level. Flow does remain patent, filling a large right transverse sinus and sigmoid sinus. The right jugular vein appears widely patent.  Left transverse sinus appears completely occluded due to tumor invasion. Cortical veins do reconstitute flow in the sigmoid sinus region with some flow in the left jugular vein.  Flow is present within the internal cerebral veins and the proximal straight sinus, but the distal straight sinus appears compressed or invaded such that flow from the deep system does not progress in the normal direction. .  IMPRESSION: No flow on the left transverse sinus, secondary to tumor invasion or obliteration. Reconstituted flow on the left sigmoid sinus because of cortical veins entering in that region.  Constriction of the distal sagittal sinus just proximal to the torcula. I think there is probably some extrinsic compression and possibly some actual tumor invasion at this location. Flow on the right does remain patent.  Constriction and/ or invasion of the distal straight sinus.   Electronically Signed   By: Nelson Chimes M.D.   On: 08/05/2013 10:37     Scheduled Meds: . dexamethasone  4 mg Intravenous Q6H  . hydrochlorothiazide  25 mg Oral Daily  . lisinopril  20 mg Oral Daily  . meloxicam  15 mg Oral Daily  . metoprolol  50 mg Oral BID  . pantoprazole  40 mg Oral BID  . pneumococcal 23 valent vaccine  0.5 mL Intramuscular Tomorrow-1000  . simvastatin  10 mg Oral QHS  Continuous Infusions:   Principal Problem:   Metastatic cancer to brain Active Problems:   Anal carcinoma  [facility] Pager [phone]. If 7PM-7AM, please contact night-coverage at [email or web address], password [facility] 08/05/2013, 11:31 AM  LOS: 2 days

## 2013-08-05 NOTE — Progress Notes (Signed)
Patient called RN into room stating she was having "some bleeding".  When I asked patient if the blood was from her vagina or from her rectum she initially said she thought maybe from her vagina.  When I looked at patients anal area did note a small abraded area possibly from where patient had been using washclothes to clean herself after having loose stools.  This area was actively bleeding when I assessed patient.  Did change patients pad and apply barrier cream to area.  Notified MD of above findings with orders to continue to monitor patient and if this area continues to bleed to notify him so further assessment could be made.

## 2013-08-05 NOTE — Progress Notes (Signed)
Nutrition Brief Note  Patient identified on the Malnutrition Screening Tool (MST) Report  Wt Readings from Last 15 Encounters:  08/03/13 189 lb 13.1 oz (86.1 kg)  07/05/13 196 lb (88.905 kg)  04/01/13 191 lb (86.637 kg)  03/02/13 193 lb (87.544 kg)  01/17/13 191 lb (86.637 kg)  12/23/12 193 lb (87.544 kg)  11/16/12 193 lb (87.544 kg)  10/08/12 189 lb (85.73 kg)  10/07/12 190 lb (86.183 kg)  03/11/12 172 lb (78.019 kg)  05/26/12 179 lb 8 oz (81.421 kg)  04/27/12 181 lb 4 oz (82.214 kg)  02/23/12 178 lb 4 oz (80.854 kg)  10/20/11 190 lb 12 oz (86.524 kg)  09/12/11 186 lb (84.369 kg)    Body mass index is 29.72 kg/(m^2). Patient meets criteria for Overweight based on current BMI. Pt reports that her appetite varies so her weight fluctuates. She denies any significant weight loss and states her appetite is much better today.  Performed nutrition-focused physical exam- no signs of major wasting (some mild wasting in arms, temples, and hands).   Current diet order is Regular, patient is consuming approximately 50-100% of meals at this time. Pt states she is eating well. Encouraged pt to eat 3 good meals daily and monitor weight with goal of weight maintenance. Labs and medications reviewed.   No nutrition interventions warranted at this time. If nutrition issues arise, please consult RD.   Pryor Ochoa RD, LDN Inpatient Clinical Dietitian Pager: [phone] After Hours Pager: [phone]

## 2013-08-06 DIAGNOSIS — E538 Deficiency of other specified B group vitamins: Secondary | ICD-10-CM

## 2013-08-06 DIAGNOSIS — C3490 Malignant neoplasm of unspecified part of unspecified bronchus or lung: Secondary | ICD-10-CM

## 2013-08-06 LAB — CBC
HEMATOCRIT: 33.1 % — AB (ref 36.0–46.0)
Hemoglobin: 11.2 g/dL — ABNORMAL LOW (ref 12.0–15.0)
MCH: 29.5 pg (ref 26.0–34.0)
MCHC: 33.8 g/dL (ref 30.0–36.0)
MCV: 87.1 fL (ref 78.0–100.0)
Platelets: 336 10*3/uL (ref 150–400)
RBC: 3.8 MIL/uL — ABNORMAL LOW (ref 3.87–5.11)
RDW: 14 % (ref 11.5–15.5)
WBC: 7.3 10*3/uL (ref 4.0–10.5)

## 2013-08-06 LAB — GLUCOSE, CAPILLARY
GLUCOSE-CAPILLARY: 127 mg/dL — AB (ref 70–99)
GLUCOSE-CAPILLARY: 129 mg/dL — AB (ref 70–99)
Glucose-Capillary: 110 mg/dL — ABNORMAL HIGH (ref 70–99)

## 2013-08-06 MED ORDER — DEXAMETHASONE 4 MG PO TABS: 4.0000 mg | ORAL_TABLET | Freq: Two times a day (BID) | ORAL | Status: AC

## 2013-08-06 NOTE — Progress Notes (Signed)
   CARE MANAGEMENT NOTE 08/06/2013  Patient:  Bailey, Howard   Account Number:  [id number]  Date Initiated:  08/06/2013  Documentation initiated by:  [facility]  Subjective/Objective Assessment:   adm: Metastatic cancer to brain     Action/Plan:   discharge planning   Anticipated DC Date:  08/06/2013   Anticipated DC Plan:  [place]  CM consult      [facility] Choice  HOME HEALTH   Choice offered to / List presented to:  C-1 Patient        [place] arranged  HH-1 RN  [place].   Status of service:  Completed, signed off Medicare Important Message given?   (If response is "NO", the following Medicare IM given date fields will be blank) Date Medicare IM given:   Date Additional Medicare IM given:    Discharge Disposition:  [place]  Per UR Regulation:    If discussed at Long Length of Stay Meetings, dates discussed:    Comments:  08/06/13 15:00 CM received a call from MD stating niece, Bailey Howard, [phone], was very upset bc Aunt (pt) was being discharged; hea asked I call the niece.  CM called Bailey Howard and she stated she was very upset CM did not call her before this time.  I looked to see if a consult had been placed earlier; it had not.  I explained to her the MD states pt is stable for discharge and pt's nurse also felt pt was ready for discharge as she has been very independent in the room.  WE discussed the follow up appt with pt's oncologist and discussed the path of hospice (though the pt states she wants to discuss this with her oncologist).  Bailey Howard felt she needed close supervision but stated she cannot be with her Aunt and there is no one else.  Bailey Howard requested a private duty list for help and CM brought thelist to her upon discharge.  CM also suggested [place] to MD and he agreed an Therapist, sports eval, aide, and SW was appropriate.  [facility] was notified of  request and will render the HHRN/SW/aide services.  Address and contact numbers were verified and pt and Bailey Howard agree Bailey Howard should be the contact for pt.  No other CM needs were communicated. Bailey Howard, BSN, CM [phone].

## 2013-08-06 NOTE — Progress Notes (Signed)
Arranged for Home Health RN, aide and social worker for patient prior to discharge.

## 2013-08-06 NOTE — Discharge Summary (Signed)
Physician Discharge Summary  ANAIZ QAZI VEH:[id number] DOB: 1945-08-25 DOA: 08/03/2013  PCP: Rica Mast, MD  Admit date: 08/03/2013 Discharge date: 08/06/2013  Recommendations for Outpatient Follow-up:  1. See medical oncologist and radiation oncologist next week as scheduled 2. Tissue biopsy for diagnosis of lung tumor 3. 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 female with h/o anal cancer in 2013. She presents to [facility] ED, with c/o floaters and per family 10 day history of increased confusion. At baseline lives alone and able to care for self. Family noticed patient went down the wrong street today and so brought in by family.  Work up in [facility] ED revealed new brain lesions on CT head in her L hemisphere "highly suspicious for metastatic disease" with "extensive" amount of surrounding vasogenic edema, they also note a hyper attenuating component of these lesions "which could represent dense cells or small volume hemorrhage". Neurosurgery was called and requested patient to be transferred to [facility].  Hospital Course:  1. Metastatic cancer to brain, appears that primary may be bronchogenic carcinoma of lungs  - given history and appearance on CT most likely these brain lesions represent metastatic cancer  - History of rectal cancer, treated with radiation and chemo  - Treating the extensive vasogenic edema on Decadron. Pt has responded very well to decadron.  Will have her continue for a few more days until she sees her rad oncologist  on Monday at 1:30pm as scheduled.  I spoke with him on the phone.    - Neurosurgery consulted and felt that there was no immediate surgical issues to address - Recs for full metastatic survey- pt to see her oncologist in 2 days for appointment. - radiation oncology consulted:  I spoke with Dr. Benay Spice in medical oncology, also, I contacted pt's primary radiation oncologist team at [facility] Dr. Baruch Gouty (rad onc) who says he will see patient Monday at 1:30pm. He says he will get her in contact with her medical oncologist Dr. Grayland Ormond and also arrange for tissue biopsy and proceed with radiation treatments, etc.  Pt initially had some diarrhea but that has resolved completely.    Procedures:  See below  Consultations:  Radiation oncology  Medical oncology  neurosurgery  Discharge Exam: Pt without complaints today.  She reports that her vision is better and she feels strong. Pt is asking to go home and says she will be sure to follow up on Monday with radiation oncology as scheduled at 1:30 pm Filed Vitals:   08/06/13 0630  BP: 139/56  Pulse: 64  Temp: 97.3 F (36.3 C)  Resp: 20   General: awake, alert, oriented x 4, no distress, cooperative Cardiovascular: normal s1, s2 sounds  Respiratory: BBS clear to auscultation Abd: soft, nondistended, nontender, no masses palpated Neuro: nonfocal   Discharge Instructions  Discharge Orders   Future Appointments Provider Department Dept Phone   08/08/2013 10:00 AM Jackolyn Confer, MD [place] [phone]   08/16/2013 3:00 PM Juanito Doom, MD [place]  [phone]  Future Orders Complete By Expires   Diet - low sodium heart healthy  As directed    Discharge instructions  As directed    Comments:     Return if symptoms recur, worsen or new problems develop. Please go to appointment with Dr. Baruch Gouty on Monday at 1:30 pm as scheduled Please see your oncologist Dr. Grayland Ormond as soon as possible    Discontinue IV  As directed    Driving Restrictions  As directed    Comments:     No driving or operating machinery until you have been cleared to resume by your oncologist.   Increase activity slowly  As directed        Medication List    STOP taking these medications       meloxicam 15 MG tablet  Commonly known as:  MOBIC      TAKE these medications       aspirin 81 MG EC tablet  Take 81 mg by mouth daily.     CENTRUM SILVER PO  Take 1 tablet by mouth daily.     cholecalciferol 1000 UNITS tablet  Commonly known as:  VITAMIN D  Take 1,000 Units by mouth daily.     Cranberry 650 MG Caps  Take 650 mg by mouth daily.     cyanocobalamin 1000 MCG/ML injection  Commonly known as:  (VITAMIN B-12)  Inject 1,000 mcg into the muscle every 30 (thirty) days.     dexamethasone 4 MG tablet  Commonly known as:  DECADRON  Take 1 tablet (4 mg total) by mouth 2 (two) times daily with a meal.  Start taking on:  08/07/2013     Fish Oil 1200 MG Caps  Take 1 capsule by mouth daily.     GLUCOSAMINE CHONDROITIN ADV PO  Take 1 tablet by mouth daily.     lisinopril-hydrochlorothiazide 20-25 MG per tablet  Commonly known as:  PRINZIDE,ZESTORETIC  Take 1 tablet by mouth daily.     metoprolol 50 MG tablet  Commonly known as:  LOPRESSOR  Take 50 mg by mouth 2 (two) times daily.     pantoprazole 40 MG tablet  Commonly known as:  PROTONIX  Take 40 mg by mouth 2 (two) times daily.     simvastatin 10 MG tablet  Commonly known as:  ZOCOR  Take 10 mg by mouth at bedtime.     traMADol 50 MG tablet  Commonly known as:  ULTRAM  Take 50 mg by mouth every 12 (twelve) hours as needed for moderate pain.     vitamin C 500 MG tablet  Commonly known as:  ASCORBIC ACID  Take 500 mg by mouth daily.     vitamin E 400 UNIT capsule  Take 400 Units by mouth daily.       Allergies  Allergen Reactions  . Morphine And Related Nausea And Vomiting  . Oxycodone Nausea And Vomiting        Follow-up Information   Follow up with [facility] MD On 08/08/2013. (1:30 pm Hospital Followup - He is expecting you )    Contact information:   [address]   [place]   [place] [address] [phone]       Follow up with Lloyd Huger, MD. Schedule an appointment as soon as possible for a visit in 5 days. [facility] Followup ASAP)    Specialty:  Oncology   Contact information:   [address] [place] [address] [phone]  Follow up with Rica Mast, MD. Schedule an appointment as soon as possible for a visit in 1 week. [facility] Followup )    Specialty:  Internal Medicine   Contact information:   [address] [place] [address] [phone]       The results of significant diagnostics from this hospitalization (including imaging, microbiology, ancillary and laboratory) are listed below for reference.    Significant Diagnostic Studies: Ct Abdomen Pelvis W Wo Contrast  08/04/2013   CLINICAL DATA:  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 Restaging.  EXAM: CT CHEST, ABDOMEN, AND PELVIS WITH CONTRAST  TECHNIQUE: Multidetector CT imaging of the chest, abdomen and pelvis was performed following the standard protocol during bolus administration of intravenous contrast.  CONTRAST:  132mL OMNIPAQUE IOHEXOL 300 MG/ML  SOLN  COMPARISON:  NM PET TUM IMG RESTAG (PS) SKULL BASE T - THIGH dated 03/16/2012; CT ANGIO CHEST W/CM &/OR WO/CM dated 08/06/2010; CT ABD-PELV W/O CM dated 04/29/2011  FINDINGS: CT CHEST FINDINGS  There are no enlarged mediastinal, hilar or axillary lymph nodes. There is diffuse atherosclerosis of the aorta, great vessels and coronary arteries. 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  There is trace pleural fluid on the right. There is no significant pericardial or left pleural effusion.  There has been  interval development of multiple right-sided pulmonary nodules, many subpleural in location. Some of these extend along the fissures. There is a multilobulated right upper lobe nodule measure 1.9 cm on image 18. There is a 3.0 cm mass in the right lower lobe on image 47. Numerous other smaller nodules are present. No focal nodules are identified within the left lung.  CT ABDOMEN AND PELVIS FINDINGS  The liver appear stable without evidence of metastatic disease. There are stable bilateral adrenal nodules, measuring 1.4 cm on the right and 1.9 cm on the left. The spleen and pancreas appear normal. Small gallbladder laterally on image 68 or postoperative fluid collection is unchanged. There is no biliary dilatation.  Both kidneys demonstrate cortical thinning. There is a 1.2 cm low-density lesion in the lower pole of the left kidney on image 67 which is grossly unchanged from the prior PET-CT. There is no hydronephrosis.  There is a small hiatal hernia. The stomach and small bowel appear unremarkable. Patient appears to be status post ileocolonic anastomosis. No focal colonic abnormalities are identified. There are stable postsurgical changes within the anterior abdominal wall below the umbilicus. No drainable fluid collection or recurrent hernia identified.  There is diffuse aortoiliac atherosclerosis. No enlarged abdominal pelvic lymph nodes are identified. There is no ascites or peritoneal nodularity. The ovaries appears stable status post partial hysterectomy. The bladder appears normal. There is likely a degree of pelvic floor relaxation.  There is diffuse thoracolumbar spondylosis. No osseous metastases are identified. Old rib fractures are present on the right.  IMPRESSION: 1. Interval development of multiple right-sided pulmonary/subpleural nodules highly suspicious for metastatic disease. The largest lesions within the right upper and lower lobes could represent primary bronchogenic carcinoma. 2. No evidence  of abdominal pelvic metastatic disease. The visualized rectum appears stable. There is probable pelvic floor relaxation. 3. Stable small bilateral adrenal nodules. 4. Bilateral renal cortical thinning without hydronephrosis. 5. 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   Electronically Signed   By: Camie Patience M.D.   On: 08/04/2013 16:01   Ct Chest W  Contrast  08/04/2013   CLINICAL DATA:  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 Restaging.  EXAM: CT CHEST, ABDOMEN, AND PELVIS WITH CONTRAST  TECHNIQUE: Multidetector CT imaging of the chest, abdomen and pelvis was performed following the standard protocol during bolus administration of intravenous contrast.  CONTRAST:  144mL OMNIPAQUE IOHEXOL 300 MG/ML  SOLN  COMPARISON:  NM PET TUM IMG RESTAG (PS) SKULL BASE T - THIGH dated 03/16/2012; CT ANGIO CHEST W/CM &/OR WO/CM dated 08/06/2010; CT ABD-PELV W/O CM dated 04/29/2011  FINDINGS: CT CHEST FINDINGS  There are no enlarged mediastinal, hilar or axillary lymph nodes. There is diffuse atherosclerosis of the aorta, great vessels and coronary arteries. 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  There is trace pleural fluid on the right. There is no significant pericardial or left pleural effusion.  There has been interval development of multiple right-sided pulmonary nodules, many subpleural in location. Some of these extend along the fissures. There is a multilobulated right upper lobe nodule measure 1.9 cm on image 18. There is a 3.0 cm mass in the right lower lobe on image 47. Numerous other smaller nodules are present. No focal nodules are identified within the left lung.  CT ABDOMEN AND PELVIS FINDINGS  The liver appear stable without evidence of metastatic disease. There are stable bilateral adrenal nodules, measuring 1.4 cm on the right and 1.9 cm on the left. The spleen and pancreas appear  normal. Small gallbladder laterally on image 68 or postoperative fluid collection is unchanged. There is no biliary dilatation.  Both kidneys demonstrate cortical thinning. There is a 1.2 cm low-density lesion in the lower pole of the left kidney on image 67 which is grossly unchanged from the prior PET-CT. There is no hydronephrosis.  There is a small hiatal hernia. The stomach and small bowel appear unremarkable. Patient appears to be status post ileocolonic anastomosis. No focal colonic abnormalities are identified. There are stable postsurgical changes within the anterior abdominal wall below the umbilicus. No drainable fluid collection or recurrent hernia identified.  There is diffuse aortoiliac atherosclerosis. No enlarged abdominal pelvic lymph nodes are identified. There is no ascites or peritoneal nodularity. The ovaries appears stable status post partial hysterectomy. The bladder appears normal. There is likely a degree of pelvic floor relaxation.  There is diffuse thoracolumbar spondylosis. No osseous metastases are identified. Old rib fractures are present on the right.  IMPRESSION: 1. Interval development of multiple right-sided pulmonary/subpleural nodules highly suspicious for metastatic disease. The largest lesions within the right upper and lower lobes could represent primary bronchogenic carcinoma. 2. No evidence of abdominal pelvic metastatic disease. The visualized rectum appears stable. There is probable pelvic floor relaxation. 3. Stable small bilateral adrenal nodules. 4. Bilateral renal cortical thinning without hydronephrosis. 5. 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   Electronically Signed   By: Camie Patience M.D.   On: 08/04/2013 16:01   Mr Jeri Cos PF Contrast  08/04/2013   CLINICAL DATA:  Visual changes and speech difficulty. History of anorectal cancer with multiple lesions seen on recent head CT.  EXAM: MRI HEAD WITHOUT AND WITH CONTRAST  TECHNIQUE:  Multiplanar, multiecho pulse sequences of the brain and surrounding structures were obtained without and with intravenous contrast.  CONTRAST:  69mL MULTIHANCE GADOBENATE DIMEGLUMINE 529 MG/ML IV SOLN  COMPARISON:  Head CT 06/13/2014  FINDINGS: Heterogeneously enhancing left frontal mass measures 4.1 x  3.8 cm with mild surrounding vasogenic edema. A smaller lesion more anteriorly and inferiorly in the left frontal lobe measures 1 cm with small amount of surrounding edema. There is an enhancing lesion in the left occipital region which appears extra-axial and is invading/eroding the occipital bone. This lesion is difficult to separate from adjacent brain parenchyma and difficult to measure, however on axial T2 images there is the suggestion of a lesion measuring 5.5 x 2.7 cm (series 11, image 6). The lesion appears to invade the proximal portion of the left transverse sinus, and there is extensive enhancement and edema throughout the left occipital lobe, with much of the enhancement appearing gyriform. There is a small amount of intrinsic T1 hyperintensity within the left occipital lobe suggestive of hemorrhage. Edema extends up into the left parietal lobe, and there is also a small amount of edema and enhancement in the superior left cerebellum. There are patchy areas of restricted diffusion within the left occipital lobe. There is abnormal enhancement and thickening of the left tentorium superiorly.  There is no midline shift. There is mild mass effect on the posterior aspect of the left lateral ventricle. There is mild generalized cerebral atrophy. Small, scattered foci of T2 hyperintensity within the subcortical and deep cerebral white matter are nonspecific but compatible with minimal chronic small vessel ischemic disease. Orbits are unremarkable. There is mild left-greater-than-right maxillary sinus mucosal thickening. Mastoid air cells are clear.  IMPRESSION: Two enhancing lesions in the left frontal lobe and  an extra-axial (likely dural) lesion in the left occipital region, concerning for metastases. The left occipital lesion appears to invade the left transverse sinus, with edema and enhancement in the left parieto-occipital region and superior left cerebellum most concerning for venous infarcts.  These results were called by telephone at the time of interpretation on 08/04/2013 at 5:00 PM to Dr. Kary Kos , who verbally acknowledged these results.   Electronically Signed   By: Logan Bores   On: 08/04/2013 17:01   Mr Mrv Head Wo Cm  08/05/2013   CLINICAL DATA:  Visual disturbance. Speech difficulty. Metastatic disease. Possible left transverse sinus invasion on the previous exam.  EXAM: MR MRV HEAD WITHOUT CONTRAST  TECHNIQUE: Multiplanar, multisequence MR imaging was performed. No intravenous contrast was administered.  COMPARISON:  08/04/2013  FINDINGS: Sagittal sinus appears normal in its proximal extent. There is slight narrowing of the distal sagittal sinus just proximal to the torcula. I think this is due to a combination of extrinsic compression from adjacent tumor and some amount of invasive tumor within the sinus at this level. Flow does remain patent, filling a large right transverse sinus and sigmoid sinus. The right jugular vein appears widely patent.  Left transverse sinus appears completely occluded due to tumor invasion. Cortical veins do reconstitute flow in the sigmoid sinus region with some flow in the left jugular vein.  Flow is present within the internal cerebral veins and the proximal straight sinus, but the distal straight sinus appears compressed or invaded such that flow from the deep system does not progress in the normal direction. .  IMPRESSION: No flow on the left transverse sinus, secondary to tumor invasion or obliteration. Reconstituted flow on the left sigmoid sinus because of cortical veins entering in that region.  Constriction of the distal sagittal sinus just proximal to the  torcula. I think there is probably some extrinsic compression and possibly some actual tumor invasion at this location. Flow on the right does remain patent.  Constriction  and/ or invasion of the distal straight sinus.   Electronically Signed   By: Nelson Chimes M.D.   On: 08/05/2013 10:37   Microbiology: No results found for this or any previous visit (from the past 240 hour(s)).   Labs: Basic Metabolic Panel: No results found for this basename: NA, K, CL, CO2, GLUCOSE, BUN, CREATININE, CALCIUM, MG, PHOS,  in the last 168 hours Liver Function Tests: No results found for this basename: AST, ALT, ALKPHOS, BILITOT, PROT, ALBUMIN,  in the last 168 hours No results found for this basename: LIPASE, AMYLASE,  in the last 168 hours No results found for this basename: AMMONIA,  in the last 168 hours CBC:  Recent Labs Lab 08/06/13 0615  WBC 7.3  HGB 11.2*  HCT 33.1*  MCV 87.1  PLT 336   Cardiac Enzymes: No results found for this basename: CKTOTAL, CKMB, CKMBINDEX, TROPONINI,  in the last 168 hours BNP: BNP (last 3 results) No results found for this basename: PROBNP,  in the last 8760 hours CBG:  Recent Labs Lab 08/05/13 1234 08/05/13 1803 08/06/13 0013 08/06/13 0631 08/06/13 1137  GLUCAP 121* 158* 127* 110* 129*   Signed:  Clanford Johnson  [facility] 08/06/2013, 12:10 PM

## 2013-08-06 NOTE — Discharge Instructions (Signed)
Please see Dr. Baruch Gouty at 1:30 pm on Monday See Dr. Grayland Ormond as soon as possible for follow up  Return if symptoms recur, worsen or new problems develop No driving or operating machinery until cleared to resume by your oncologist

## 2013-08-08 ENCOUNTER — Ambulatory Visit: Payer: Self-pay | Admitting: Oncology

## 2013-08-08 ENCOUNTER — Ambulatory Visit: Payer: Medicare Other | Admitting: Internal Medicine

## 2013-08-09 ENCOUNTER — Telehealth: Payer: Self-pay | Admitting: *Deleted

## 2013-08-09 NOTE — Telephone Encounter (Signed)
Nurse from [facility] left a message on my voicemail, I could not understand her name. However she wanted to inform us they were not able to admit patient this week due to her having several appointments this week. They probably will not be able to do anything this and wanted to inform you of this.

## 2013-08-14 ENCOUNTER — Ambulatory Visit: Payer: Self-pay | Admitting: Oncology

## 2013-08-16 ENCOUNTER — Institutional Professional Consult (permissible substitution): Payer: Medicare Other | Admitting: Pulmonary Disease

## 2013-08-18 ENCOUNTER — Ambulatory Visit: Payer: Self-pay | Admitting: Oncology

## 2013-08-22 LAB — CBC CANCER CENTER
BASOS ABS: 0 x10 3/mm (ref 0.0–0.1)
Basophil %: 0.2 %
Eosinophil #: 0.2 x10 3/mm (ref 0.0–0.7)
Eosinophil %: 1.2 %
HCT: 37.4 % (ref 35.0–47.0)
HGB: 12.2 g/dL (ref 12.0–16.0)
LYMPHS ABS: 0.3 x10 3/mm — AB (ref 1.0–3.6)
Lymphocyte %: 2.2 %
MCH: 29 pg (ref 26.0–34.0)
MCHC: 32.6 g/dL (ref 32.0–36.0)
MCV: 89 fL (ref 80–100)
MONOS PCT: 5.8 %
Monocyte #: 0.7 x10 3/mm (ref 0.2–0.9)
Neutrophil #: 11.6 x10 3/mm — ABNORMAL HIGH (ref 1.4–6.5)
Neutrophil %: 90.6 %
Platelet: 237 x10 3/mm (ref 150–440)
RBC: 4.2 10*6/uL (ref 3.80–5.20)
RDW: 15.7 % — ABNORMAL HIGH (ref 11.5–14.5)
WBC: 12.9 x10 3/mm — ABNORMAL HIGH (ref 3.6–11.0)

## 2013-09-11 ENCOUNTER — Ambulatory Visit: Payer: Self-pay | Admitting: Oncology

## 2013-09-19 IMAGING — US ULTRASOUND LEFT BREAST
1 series · 18 of 25 positions shown · non-contrast
Comparison: none

REASON FOR EXAM: AV LT PARENCHYMAL DENSITY
COMMENTS:

PROCEDURE:     US  - US BREAST LEFT  - October 02, 2011  [DATE]
RESULT:     Reference is made to additional view mammography report which
incorporates ultrasound findings.

[Series 1: ultrasound left breast · 31 acquisitions, 18 frames shown]
[im 1/31]
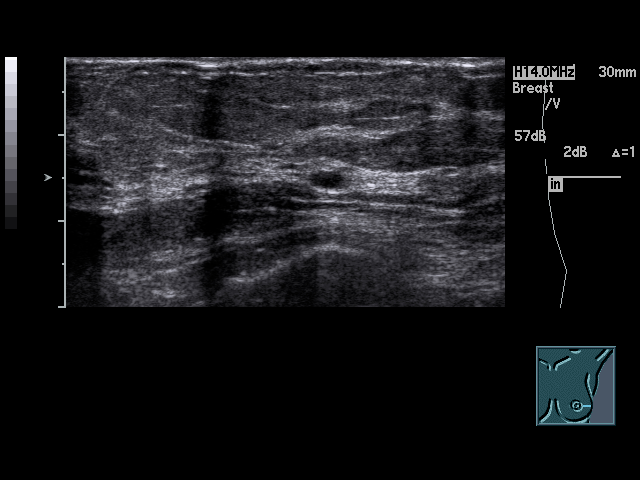
[im 3/31]
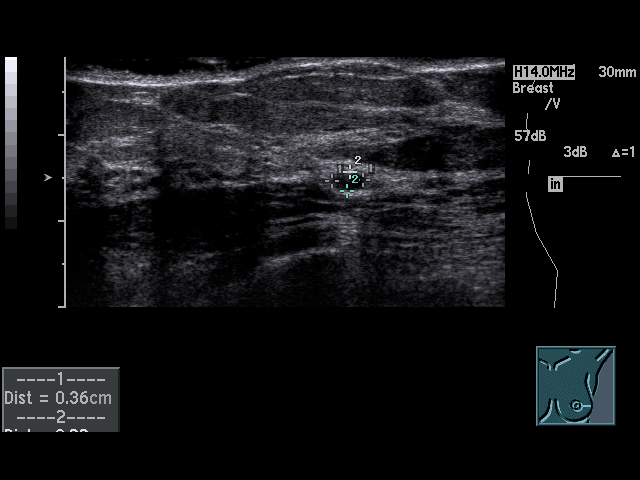
[im 4/31]
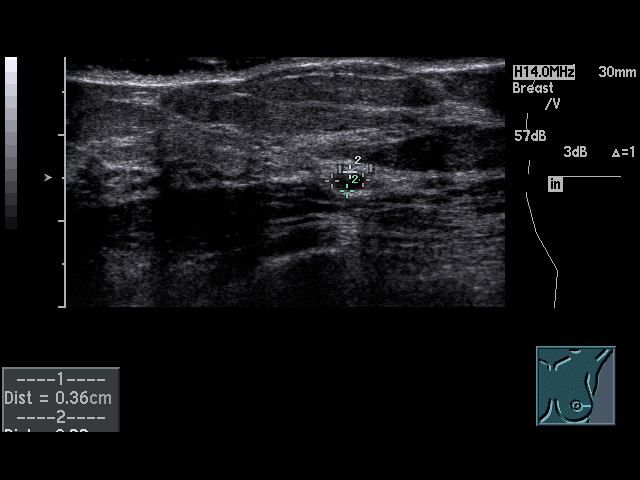
[im 6/31]
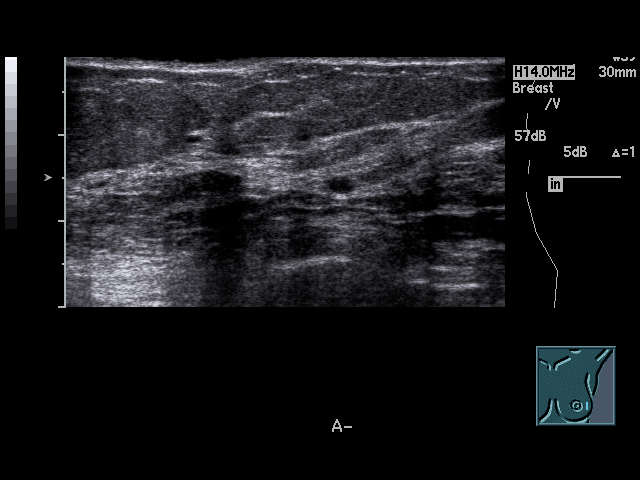
[im 8/31]
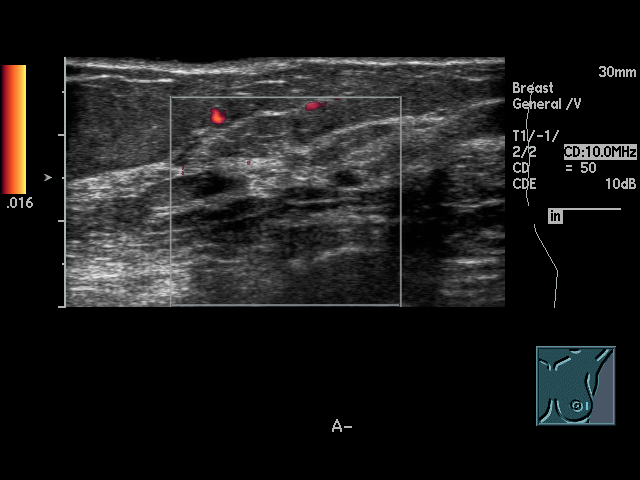
[im 9/31]
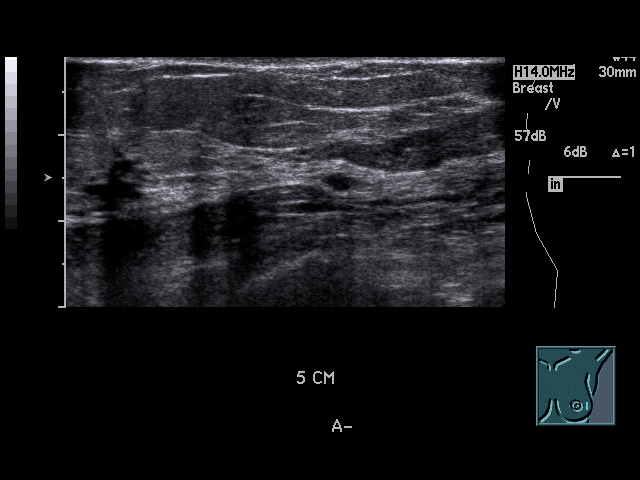
[im 12/31]
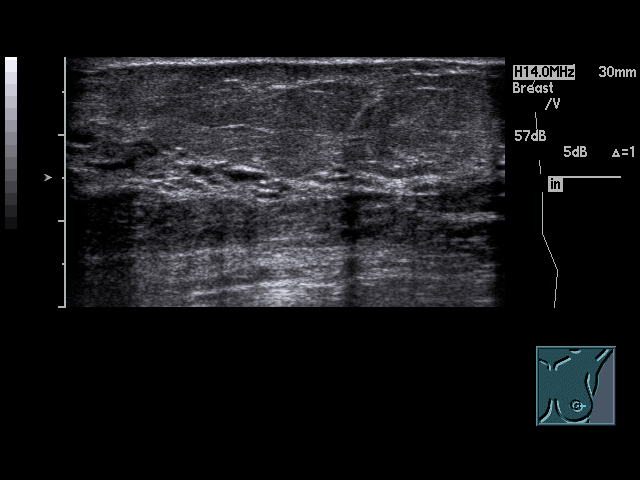
[im 13/31]
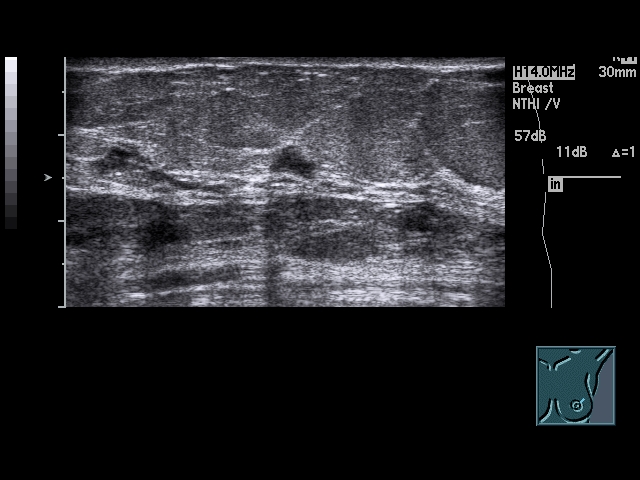
[im 14/31]
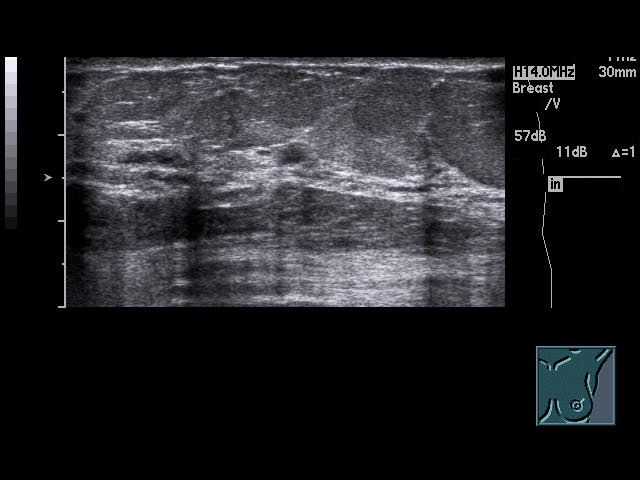
[im 17/31]
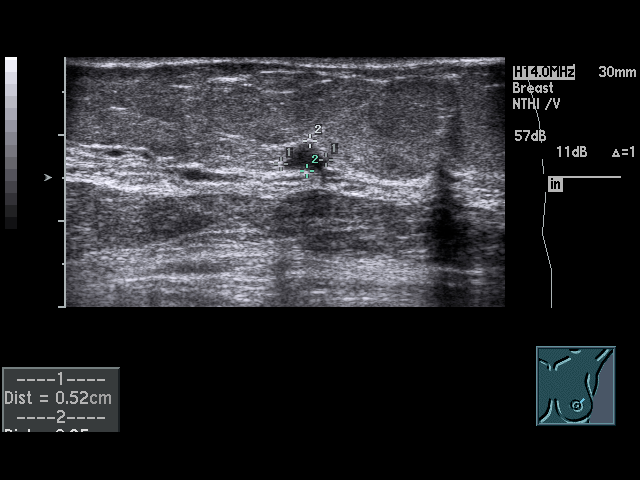
[im 18/31]
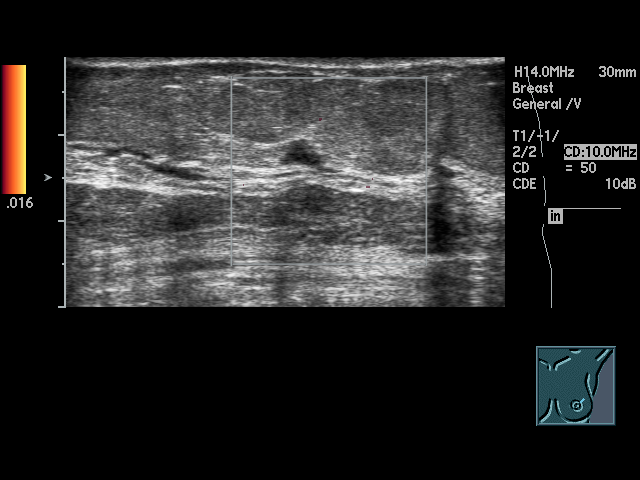
[im 19/31]
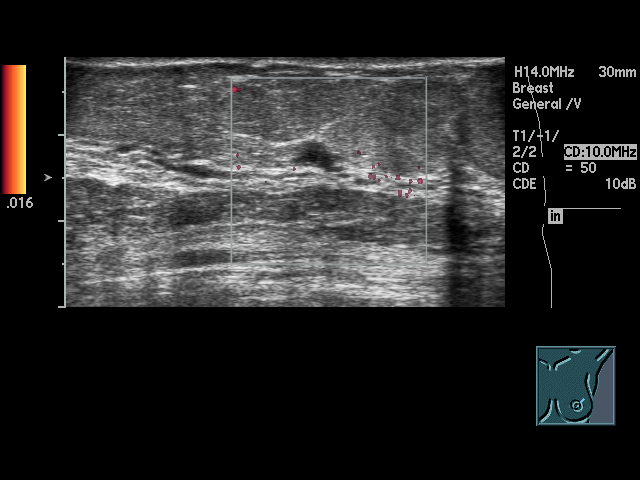
[im 22/31]
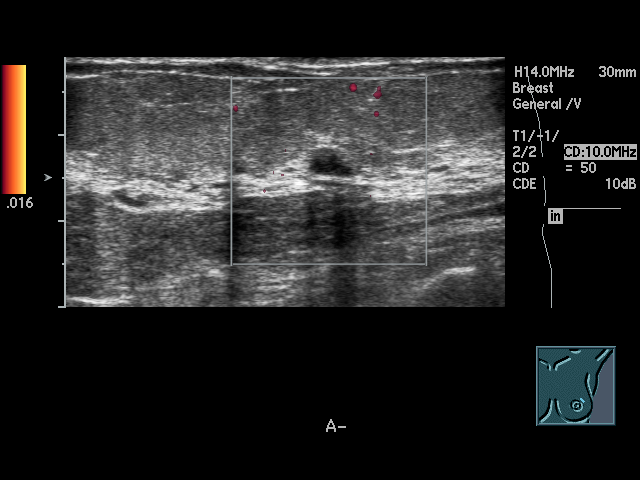
[im 23/31]
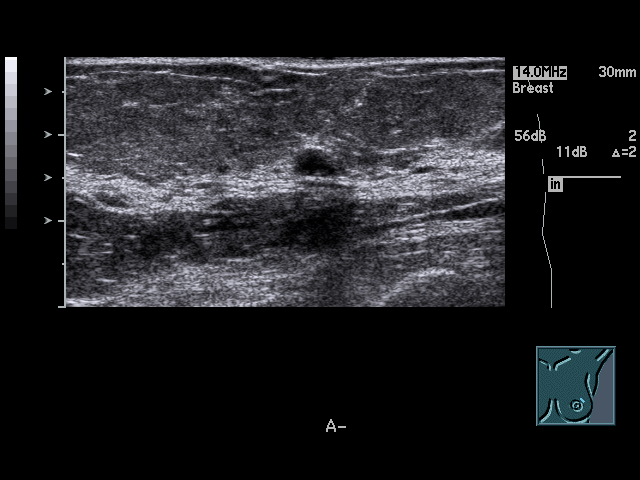
[im 26/31]
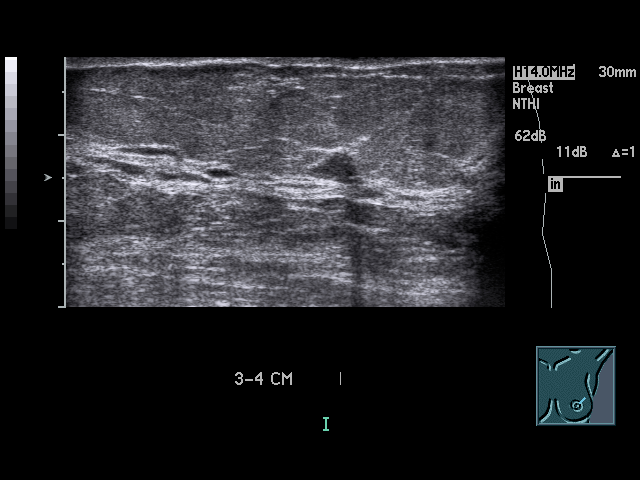
[im 27/31]
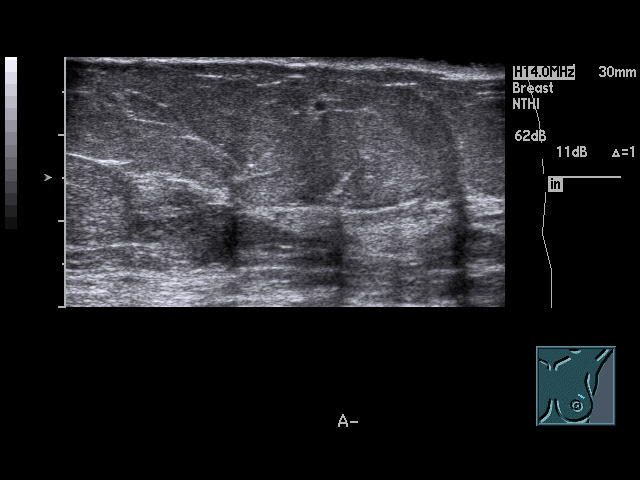
[im 28/31]
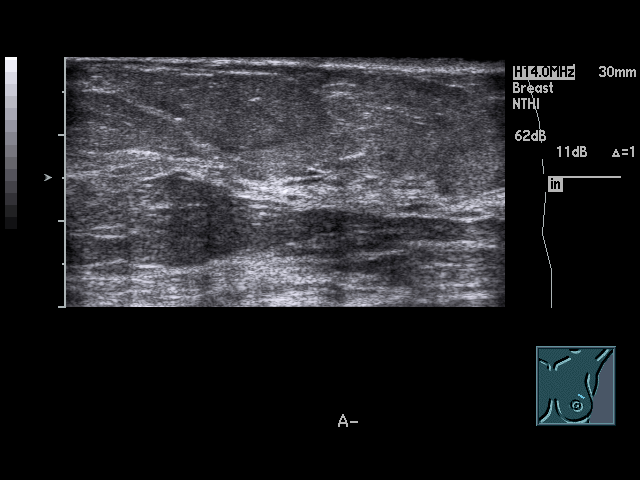
[im 31/31]
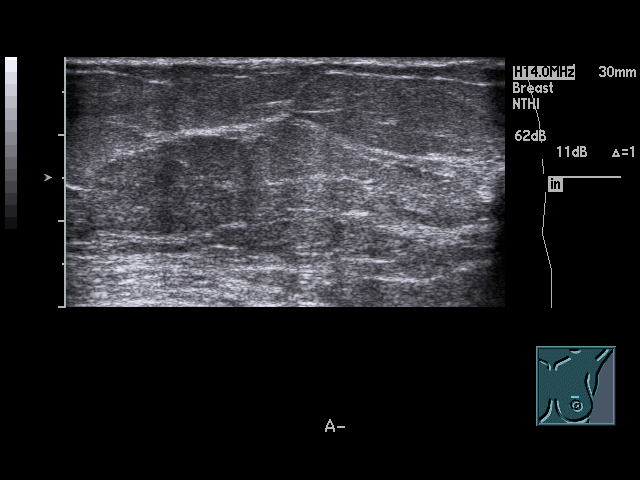

[18 of 25 positions shown; findings below may reference images not displayed]

IMPRESSION: BI-RADS: Category 4 - Suspicious Abnormality.

## 2013-10-12 ENCOUNTER — Ambulatory Visit: Payer: Medicare Other | Admitting: General Surgery

## 2013-10-29 ENCOUNTER — Emergency Department: Payer: Self-pay | Admitting: Emergency Medicine

## 2013-10-29 LAB — COMPREHENSIVE METABOLIC PANEL
Albumin: 2.9 g/dL — ABNORMAL LOW (ref 3.4–5.0)
Alkaline Phosphatase: 55 U/L
Anion Gap: 10 (ref 7–16)
BUN: 23 mg/dL — ABNORMAL HIGH (ref 7–18)
Bilirubin,Total: 1.6 mg/dL — ABNORMAL HIGH (ref 0.2–1.0)
CHLORIDE: 106 mmol/L (ref 98–107)
CREATININE: 1.39 mg/dL — AB (ref 0.60–1.30)
Calcium, Total: 9.3 mg/dL (ref 8.5–10.1)
Co2: 23 mmol/L (ref 21–32)
EGFR (African American): 44 — ABNORMAL LOW
EGFR (Non-African Amer.): 38 — ABNORMAL LOW
GLUCOSE: 92 mg/dL (ref 65–99)
Osmolality: 281 (ref 275–301)
POTASSIUM: 3.5 mmol/L (ref 3.5–5.1)
SGOT(AST): 14 U/L — ABNORMAL LOW (ref 15–37)
SGPT (ALT): 18 U/L (ref 12–78)
Sodium: 139 mmol/L (ref 136–145)
TOTAL PROTEIN: 6.4 g/dL (ref 6.4–8.2)

## 2013-10-29 LAB — CBC
HCT: 36 % (ref 35.0–47.0)
HGB: 11.9 g/dL — AB (ref 12.0–16.0)
MCH: 30.6 pg (ref 26.0–34.0)
MCHC: 33.1 g/dL (ref 32.0–36.0)
MCV: 92 fL (ref 80–100)
Platelet: 308 10*3/uL (ref 150–440)
RBC: 3.9 10*6/uL (ref 3.80–5.20)
RDW: 19.8 % — ABNORMAL HIGH (ref 11.5–14.5)
WBC: 9.3 10*3/uL (ref 3.6–11.0)

## 2013-10-29 LAB — URINALYSIS, COMPLETE
BILIRUBIN, UR: NEGATIVE
BLOOD: NEGATIVE
Bacteria: NONE SEEN
GLUCOSE, UR: NEGATIVE mg/dL (ref 0–75)
KETONE: NEGATIVE
Leukocyte Esterase: NEGATIVE
Nitrite: NEGATIVE
PH: 5 (ref 4.5–8.0)
PROTEIN: NEGATIVE
RBC,UR: <1 /HPF (ref 0–5)
Specific Gravity: 1.024 (ref 1.003–1.030)
Squamous Epithelial: <1

## 2013-10-29 LAB — TROPONIN I: Troponin-I: <0.02 ng/mL

## 2014-01-11 DEATH — deceased

## 2014-05-15 ENCOUNTER — Encounter: Payer: Self-pay | Admitting: Radiation Oncology

## 2014-09-25 IMAGING — US US OUTSIDE FILMS BREAST
1 series · 9 of 9 positions shown · non-contrast
Comparison: none

[Series 1: us outside films breast · 9 of 9 slices shown]
[im 1/9]
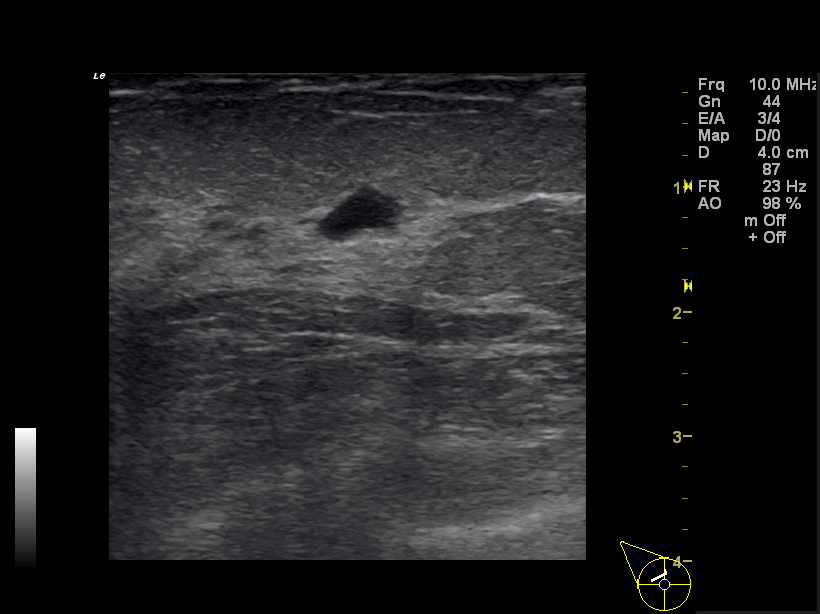
[im 2/9]
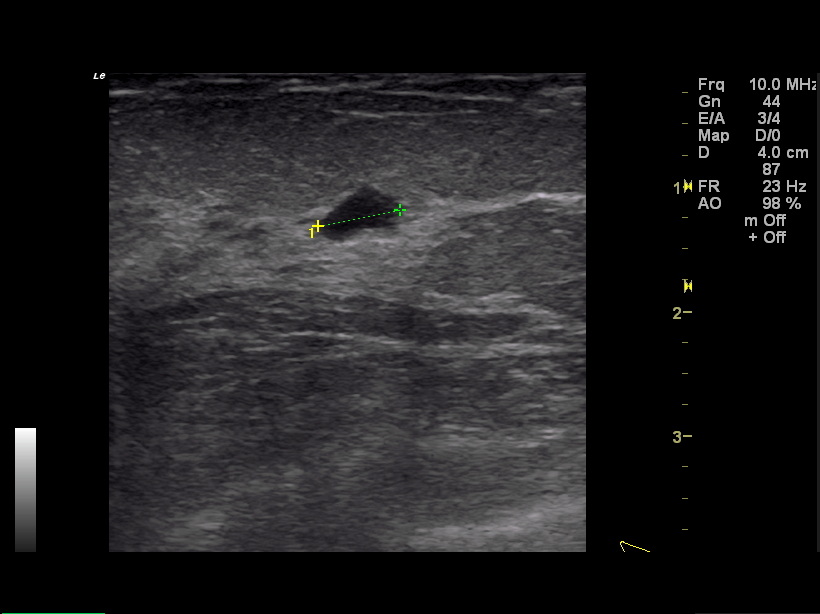
[im 3/9]
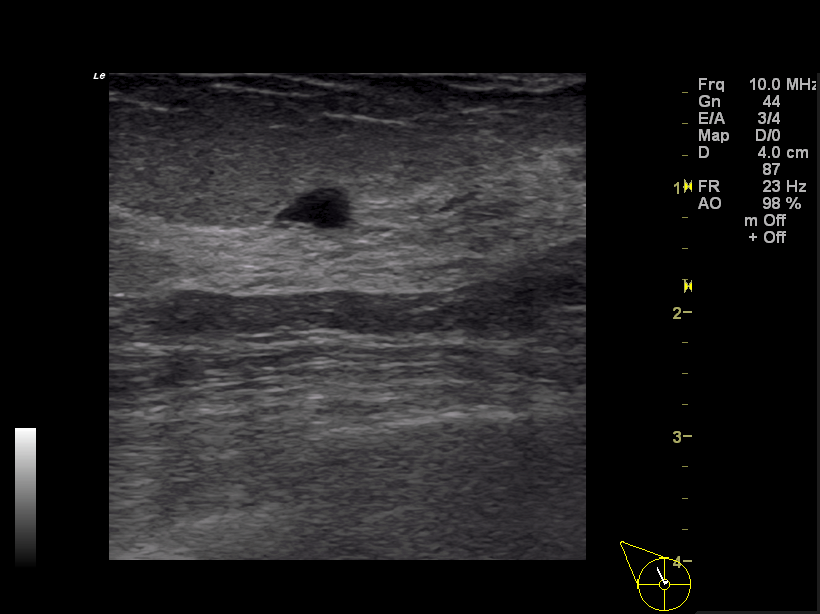
[im 4/9]
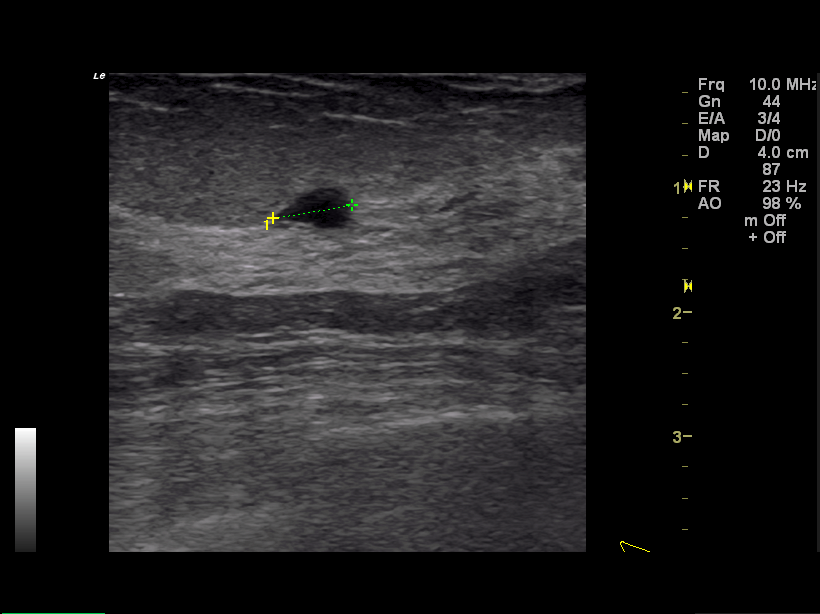
[im 5/9]
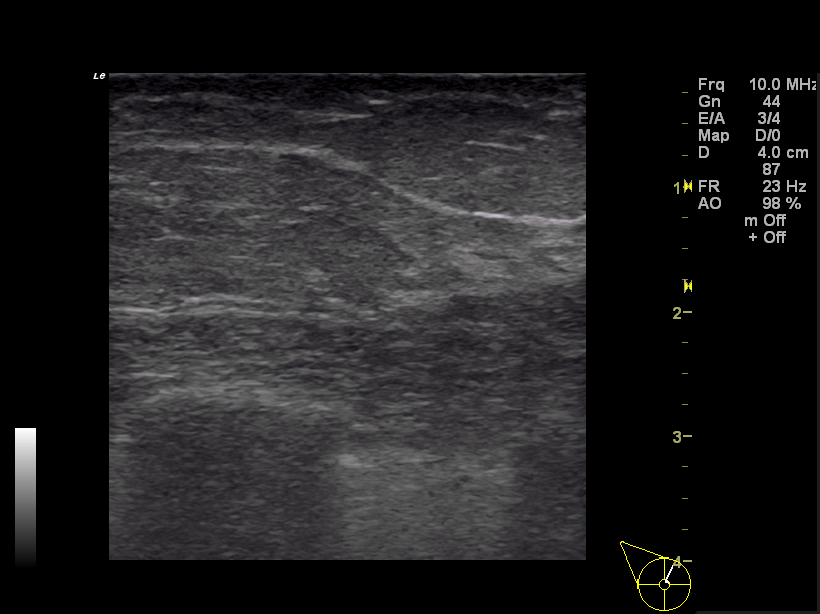
[im 6/9]
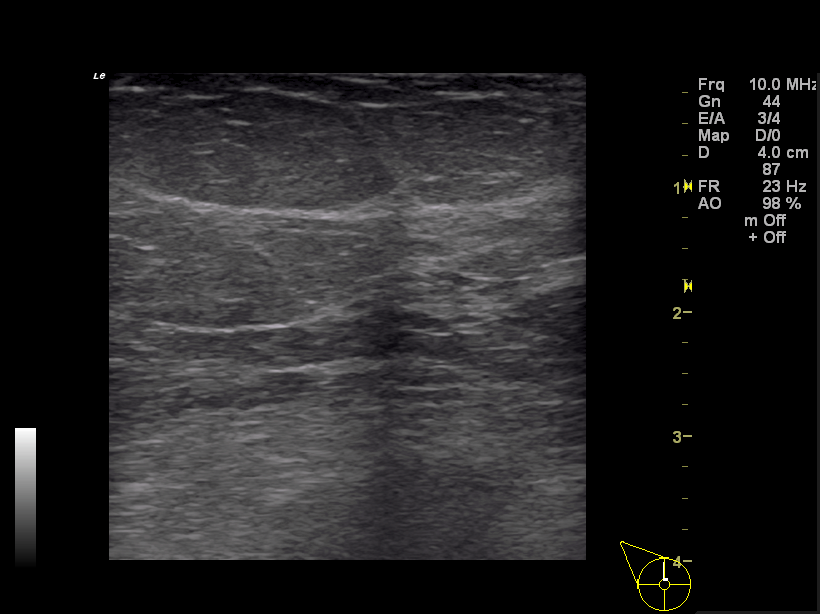
[im 7/9]
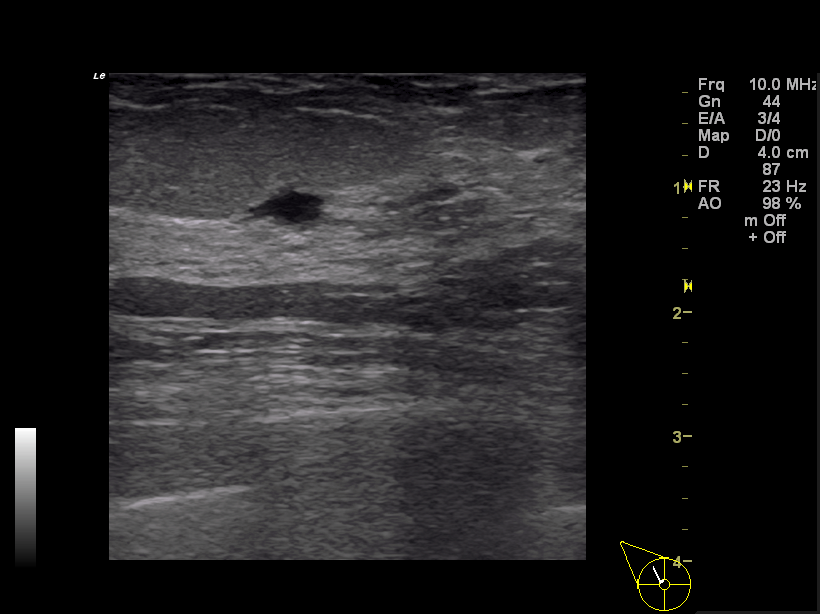
[im 8/9]
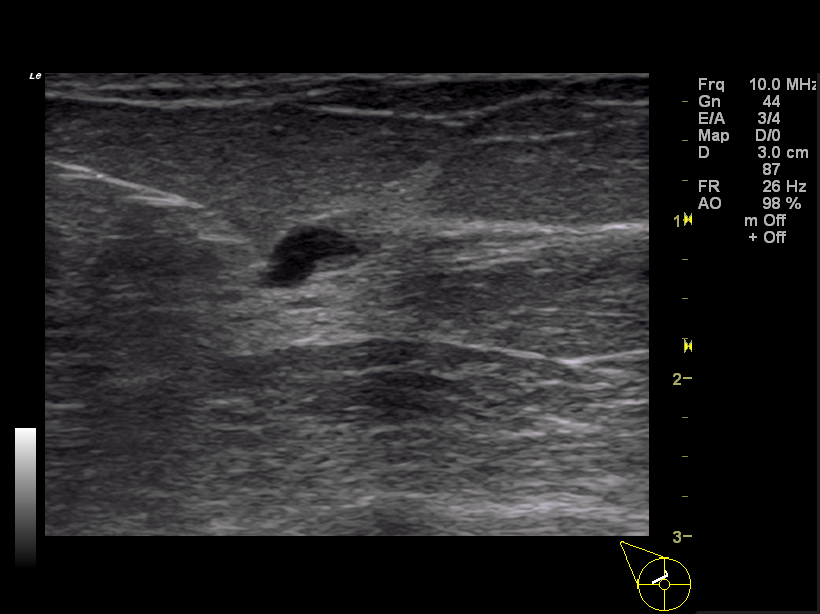
[im 9/9]
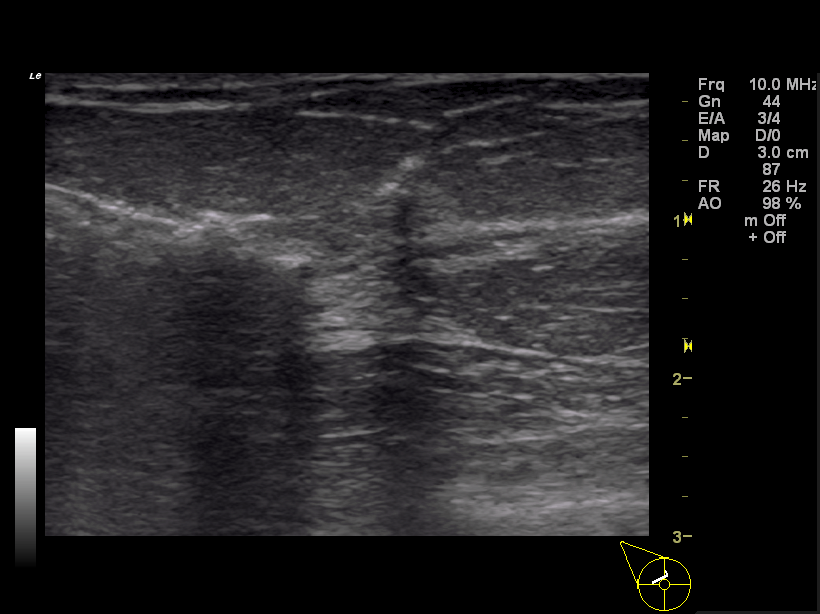

[9 of 9 positions shown; findings below may reference images not displayed]

IMAGES IMPORTED FROM THE [employer] WORKFLOW SYSTEM
NO DICTATION FOR STUDY

## 2014-11-04 NOTE — Consult Note (Signed)
Reason for Visit: This 69 year old Female patient presents to the clinic for initial evaluation of  brain metastases .  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  Pathology Report CT-guided FNA ordered of the lung lesions.   Imaging Report CT scan without contrast of the head and chest  CT  reviewed   Referral Report clinical notes reviewed   Planned Treatment Regimen palliative whole brain radiation   HPI   patient is a 69 year old female well known to our Department having received combined modality treatment  back in May of 2013 for stage IIIB (T2, N2, M0) squamous cell carcinoma anal canal. 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  She has done well over the years with PET/CT confirming complete response. Recently she presented with confusion driving down the wrong way on the street was brought to the [facility] emergency room  where CT scan without contrast revealed multiple brain lesions consistent with metastatic disease. She was transferred [facility] hospital were CT scan the chest shows multiple pulmonary nodules most likely consistent with metastatic disease. MRI of thebrain to confirm metastatic disease. She's been placed on steroids and is now seen for  evaluation. She still somewhat confused she has been seen by neurosurgery who is not thought to have any immediate impact on her overall care.  She has not had a biopsy that the lung lesions at this time. She states she's less confused having some mild headaches and some mild decreased motor strength on the right side.   Past Hx:    Anal Cancer:    H/O blood transfusion:    Anemia:    Low Back Pain:    Recurrent UTI's:    Multi-drug  Resistant Organism (MDRO): Positive culture for MRSA., 29-Apr-2011   Diverticulitis:    HTN:    Wound dehiscence: Jul 2010   Hyperlipidemia:    Gallbladder removal:    Revision of Hernia repair with plastic surgery:    Right foot surgery:    Wound Vac:    hernia repair:    Varicose Vein Ligation:    Hysterectomy:    Bladder Surgery:    Colectomy:   Past, Family and Social History:  Past Medical History positive   Cardiovascular hyperlipidemia; hypertension   Gastrointestinal diverticulitis   Genitourinary UTIs   Past Surgical History herniorrhaphy repair, wound dehiscence, right foot surgery, hysterectomy, bladder tacking, colectomy   Past Medical History Comments anemia   Family History noncontributory   Social History noncontributory   Additional Past Medical and Surgical History sseen accompanied by her niece today who works in the radiology   Allergies:   Morphine: GI Distress, N/V/Diarrhea  Oxycontin: Agitation  Home Meds:  Home Medications: Medication Instructions Status  aspirin 81 mg oral tablet 1 tab(s) orally once a day (in the evening) Active  glucosamine 1000 mg/ chondrotin 800 mg 1 tab(s) orally once a day (in the morning) Active  Vitamin C 500 mg oral tablet 1 tab(s) orally once a day (in the morning) Active  metoprolol tartrate 50 mg oral tablet 1 tab(s) orally 2 times a day Active  simvastatin 10 mg oral tablet 1 tab(s) orally once a day (in the evening) Active  cranberry oral tablet 1 tab(s) orally  once a day (in the morning) Active  Vitamin D3 1000 intl units oral capsule 1 cap(s) orally once a day Active  pantoprazole 40 mg oral delayed release tablet 1 tab(s) orally once a day Active  tramadol 50 mg oral tablet 1 tab(s) orally every 12 hours as needed for pain Active  cyanocobalamin 1000 mcg/mL injectable solution 1 milliliter(s) injectable once a month Active  dexamethasone 4 mg oral tablet 1 tab(s) orally 2 times a day Active   Fish Oil 1200 mg oral capsule 1 cap(s) orally once a day Active  hydrochlorothiazide-lisinopril 25 mg-20 mg oral tablet 1 tab(s) orally once a day Active  vitamin E 400 intl units oral capsule 1 cap(s) orally once a day Active  Centrum Antioxidant Multiple Vitamins and Minerals oral tablet 1 tab(s) orally once a day Active   Review of Systems:  General negative   Performance Status (ECOG) 0   Skin negative   Breast negative   Ophthalmologic negative   ENMT negative   Respiratory and Thorax negative   Cardiovascular negative   Gastrointestinal see HPI   Genitourinary negative   Musculoskeletal negative   Neurological see HPI   Psychiatric negative   Hematology/Lymphatics negative   Endocrine negative   Allergic/Immunologic negative   Review of Systems   review of systems obtained from nurse's notes  Nursing Notes:  Nursing Vital Signs and Chemo Nursing Nursing Notes: *CC Vital Signs Flowsheet:   26-Jan-15 13:34  Temp Temperature 97  Pulse Pulse 98  Respirations Respirations 24  SBP SBP 157  DBP DBP 86  Current Weight (kg) (kg) 85.7   Physical Exam:  General/Skin/HEENT:  General normal   Skin normal   Eyes normal   ENMT normal   Head and Neck normal   Additional PE well-developed female in NAD slightly confused. Cranial nerves II through XII are grossly intact. Crude visual fields are within normal range. Proprioception is decreased on the right side as well as subtle decrease motor strength on the right upper and lower extremity. Lungs are clear to A&P cardiac examination shows regular rate and rhythm.   Breasts/Resp/CV/GI/GU:  Respiratory and Thorax normal   Cardiovascular normal   Gastrointestinal normal   Genitourinary normal   MS/Neuro/Psych/Lymph:  Musculoskeletal normal   Lymphatics normal   Other Results:  Radiology Results: LabUnknown:    21-Jan-15 19:10, CT Head Without Contrast  PACS Image   CT:  CT Head Without  Contrast   REASON FOR EXAM:    confusion, r/o cva  COMMENTS:       PROCEDURE: CT  - CT HEAD WITHOUT CONTRAST  - Aug 03 2013  7:10PM     CLINICAL DATA:  Mental status changes. Confusion. History of anal  cancer    EXAM:  CT HEAD WITHOUT CONTRAST    TECHNIQUE:  Contiguous axial images were obtained from the base of the skull  through the vertex without intravenous contrast.  COMPARISON:  03/19/2010    FINDINGS:  Sinuses/Soft tissues: Calvarial irregularity in the left occipital  region, suspicious for direct tumor extension. Clear paranasal  sinuses and mastoid air cells.    Intracranial: A hyperattenuating mass within the left frontal lobe  measures 3.3 cm on image 22/series 2. There is surrounding  ill-defined hyperattenuation along the cortex, including on image  24/ series 2.    A separate focus of hyper attenuation in the left parietal-occipital  region measures 1.5 cm on image 19/ series 2. There is adjacent  hyperattenuation which may be  tracking along the tentorium.  Extensive vasogenic edema throughout the left cerebral hemisphere.  No midline shift or hydrocephalus. Basal cisterns are patent. Mild  motion degradation inferiorly.    No acute infarct.     IMPRESSION:  Left cerebral hemisphere lesions with vasogenic edema, highly  suspicious for metastatic disease. The hyper attenuating component  of these lesions could represent dense compact cells or small volume  hemorrhage. The left occipital lesion may extend extraaxially, given  adjacent calvarial involvement. MRI (preferably within without  contrast) recommended.    Critical test results telephoned to Dr. Kerman Passey... at the time  of interpretation at 7:34 p.m...Marland Kitchenon 08/03/2013.      Electronically Signed    By: Abigail Miyamoto M.D.    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 Would plan on delivering 3000 cGy in 10 fractions. I have set her up for CT guided fine needle aspiration to determine histology upper lung metastasis. I've also set her up for followup with Dr. Grayland Ormond for medical oncology opinion and overall management the patient should this turn out to be metastatic  anal cancer. I've also obtained her scans from [facility] for my review.  Risks and benefits of whole brain treatment including possible cognitive decline, hair loss, skin reaction the scalp, there were explained in detail to both the patient and her niece. I have set her up tomorrow for a CT simulation. I will continue her on her current dose of Decadron 4 mg twice a day as well as proton pump inhibitor to protect her stomach.  I would like to take this opportunity to thank you for allowing me to continue to participate in this patient's care.  CC Referral:  cc: Dr. Ronette Deter   Electronic Signatures: Baruch Gouty Roda Shutters (MD)  (Signed 26-Jan-15 15:46)  Authored: HPI, Diagnosis, Past Hx, PFSH, Allergies, Home Meds, ROS, Nursing Notes, Physical Exam, Other Results, Relevent Results, Encounter Assessment and Plan, CC Referring Physician   Last Updated: 26-Jan-15 15:46 by Armstead Peaks (MD)

## 2014-11-05 NOTE — Consult Note (Signed)
Reason for Visit: This 69 year old Female patient presents to the clinic for initial evaluation of  Anal cancer .   Referred by Dr. Jamal Collin.  Diagnosis:   Chief Complaint/Diagnosis   69 year old female with squamous carcinoma of the anus clinical stage IIIB (T2, N2, M0)   Pathology Report Pathology report reviewed    Imaging Report CT scan and PET CT scan reviewed    Referral Report Clinical notes reviewed    Planned Treatment Regimen Concurrent chemotherapy and IMRT radiation    HPI   patient is a 69 year old female with multiple comorbidities who is being worked up for an abnormal mammogram of her breasts by Dr. Emilee Hero. She is recently completed in herniorrhaphy repair as well as cholecystectomy at [facility]. She is noted to have some symptoms of hemorrhoids and bleeding and was noted to have a mass in theanus. Dr. Jamal Collin R. performed a colonoscopy and was found a subcutaneous mass in the left side of the anus and biopsy was positive forsquamous cell carcinoma basaloid type with evidence of angiolymphatic invasion. PET CT scan was performed showing avid uptake in the region of the anus as well as left inguinal region. She scheduled for a biopsy of that area as it is suspicious for inguinal nodal involvement. She seen today wheelchair-bound. She is having no significant diarrhea at this time or dysuria. She does have painful bowel movements and some decreased caliber of her stool.  Past Hx:    Multi-drug Resistant Organism (MDRO): Positive culture for MRSA., 29-Apr-2011   Diverticulitis:    HTN:    Wound dehiscence: Jul 2010   Hyperlipidemia:    Gallbladder removal:    Revision of Hernia repair with plastic surgery:    Right foot surgery:    Wound Vac:    hernia repair:    Varicose Vein Ligation:    Hysterectomy:    Bladder Surgery:    Colectomy:   Past, Family and Social History:   Past Medical History positive    Cardiovascular  hyperlipidemia; hypertension    Past Surgical History cholecystectomy; Herniorrhaphy repair, hysterectomy, bladder tacking surgery, varicose vein ligation    Past Medical History Comments History of MRSA    Family History noncontributory    Social History noncontributory    Additional Past Medical and Surgical History Accompanied by niece today   Allergies:   Morphine: GI Distress  Oxycontin: Agitation  Home Meds:  Home Medications: Medication Instructions Status  Bactrim DS 800 mg-160 mg tablet 1 tab(s) orally 2 times a day x 5 days Active  glucosamine  chondrotin 1 daily Active  meloxicam 15 mg oral tablet    1 daily Active  one a day multi vitamin, cholesterol plus 1 daily Active  aspirin 81 mg oral tablet 1  orally once a day  Active  Percocet 5/325 oral tablet 1 tab(s) orally every 6 hours, As Needed Active  Zofran 4 mg oral tablet 1 tab(s) orally every 6 hours, As Needed for nausea Active  hydrochlorothiazide-lisinopril 12.5 mg-10 mg oral tablet 1 tab(s) orally once a day Active  metoprolol succinate 50 mg oral tablet, extended release 1 tab(s) orally 2 times a day Active  Vitamin C 500 mg oral tablet 1 tab(s) orally once a day Active  vitamin E 400 intl units oral capsule 1 cap(s) orally once a day Active  Vitamin D2 50,000 intl units (1.25 mg) oral capsule 1 cap(s) orally once a month Active  simvastatin 10 mg oral tablet  1 tab(s) orally once a day (at bedtime) Active  omeprazole 40 mg oral delayed release capsule 1 cap(s) orally once a day Active  niacin 1000 mg oral tablet, extended release 1 tab(s) orally once a day (at bedtime) Active  cranberry oral tablet '650mg'$  1   once a day Active  Fish Oil '1200mg'$  1 cap(s)  once a day Active   Review of Systems:   General negative    Performance Status (ECOG) 1    Skin negative    Breast see HPI    Ophthalmologic negative    ENMT negative    Respiratory and Thorax negative    Cardiovascular negative     Gastrointestinal see HPI    Genitourinary negative    Musculoskeletal negative    Neurological negative    Psychiatric negative    Hematology/Lymphatics negative    Endocrine negative    Allergic/Immunologic negative   Physical Exam:  General/Skin/HEENT:   General normal    Skin normal    Eyes normal    ENMT normal    Head and Neck normal    Additional PE Well-developed slightly obese female in NAD. Lungs are clear to A&P cardiac examination shows regular rate and rhythm. On rectal exam there is firm mass circumferential in nature in the anal canal. She also has obvious enlarged adenopathy in the left inguinal region. Right inguinal region is normal. No peripheral edema in the lower extremities is noted.   Breasts/Resp/CV/GI/GU:   Respiratory and Thorax normal    Cardiovascular normal    Gastrointestinal normal    Genitourinary normal   MS/Neuro/Psych/Lymph:   Musculoskeletal normal    Neurological normal    Lymphatics normal   Other Results:  Radiology Results: CT:    16-Oct-12 19:26, CT Abdomen and Pelvis Without Contrast   CT Abdomen and Pelvis Without Contrast    REASON FOR EXAM:    (1) ab dpain; (2) pelain  COMMENTS:       PROCEDURE: CT  - CT ABDOMEN AND PELVIS W0  - Apr 29 2011  7:26PM     RESULT: Indication: Abdominal pain    Comparison: 11/21/2010    Technique: Multiple axial images from the lung bases to the symphysis   pubis were obtained with oral and without intravenous contrast.    Findings:    There is bibasilar airspace disease likely resenting atelectasis.  There is soft tissue swelling and edema in the anterior abdominal wall   and subcutaneous fat. There is a cutaneous defect noted consistent with   the surgical site. There are multiple locules of air within the   simultaneous soft tissues which may be related to recent surgery versus   infection. There is no drainable fluid collection. There is a ventral   abdominal hernia  repair. There is no fluid collection deep to the hernia   mesh. There is a fat plane seen between the bladder and the anterior   abdominal wall.    There is a 5.1 x 3.6 cm cystic left inguinal mass which is unchanged the   prior examination and may resent a lymphocele, seroma versus lymphangioma.    No renal, ureteral, or bladder calculi. No obstructive uropathy. No   perinephric stranding is seen. The kidneys are symmetric in size without   evidence for exophytic mass. The bladder is unremarkable.  The liver demonstrates no focal abnormality. The gallbladder is   surgically absent. The spleen demonstrates no focal abnormality. The   adrenal glands and pancreas  are normal.     The  stomach, duodenum, small intestine, and large intestine are   unremarkable.  There is no pneumoperitoneum, pneumatosis, or portal   venous gas. There is no abdominal or pelvic free fluid. There is no   lymphadenopathy.     The abdominal aorta is normal in caliber with atherosclerosis.    There is lumbar spine spondylosis.    IMPRESSION:     1.There is soft tissue swelling and edema in the anterior abdominal wall   and subcutaneous fat. There is a cutaneous defect noted consistent with   the surgical site. There are multiple locules of air within the   simultaneous soft tissues which may be related to recent surgery/open   wound versus infection. There is no drainable fluid collection. There is   a ventral abdominal hernia repair. There is no fluid collection deep to   the hernia mesh.     2. There is a fat plane seen between the bladder and the anterior   abdominal wall.          Verified By: Jennette Banker, M.D., MD   Assessment and Plan:  Impression:   clinical stage IIIB squamous cell carcinoma the anus based on left inguinal nodal involvement as yet to be biopsy proven in 69 year old female  Plan:   at this time I will wait final pathology results of her left inguinal node biopsy. Depending  on the status of that node may include up to high-dose radiation. Otherwise would like to use IMRT treatment planning and delivery to spare critical structures such as her bone marrow, small bowel and bladder. Would plan on delivering up to 1500 cGy 2 areas of primary tumor involvement including her anus and left inguinal region should that be biopsy positive. Would treat remainder of her lymph nodes to 5000 cGy using IMRT dose painting technique. Risks and benefits of treatment including diarrhea dysuria urinary frequency fatigue and decrease in blood counts were all explained in detail to the patient. She seems to comprehend her treatment plan well. She scheduled for biopsy of her left inguinal region next week and I set her up for CT simulation short thereafter. We will use doses constraint guidelines according to the RTOG protocols as well as M.D. Anderson's recent published papers on IMRT treatment of anal cancer.patient also be receiving mitomycin-C as well as 5-FU treatment under Dr. Gary Fleet direction concurrently with radiation.  I would like to take this opportunity to thank you for allowing me to continue to participate in this patient's care.  CC Referral:   cc: Dr. Lisette Grinder, Dr. Jamal Collin   Electronic Signatures: Baruch Gouty, Roda Shutters (MD)  (Signed 16-May-13 14:59)  Authored: HPI, Diagnosis, Past Hx, PFSH, Allergies, Home Meds, ROS, Physical Exam, Other Results, Encounter Assessment and Plan, CC Referring Physician   Last Updated: 16-May-13 14:59 by Armstead Peaks (MD)

## 2014-11-05 NOTE — Op Note (Signed)
PATIENT NAME:  Bailey Howard, Bailey Howard MR#:  [id number] DATE OF BIRTH:  Jun 18, 1946  DATE OF PROCEDURE:  12/04/2011  PREOPERATIVE DIAGNOSES:  1. Anal carcinoma.  2. Left groin lymph node.   POSTOPERATIVE DIAGNOSES:  1. Anal carcinoma.  2. Left groin lymph node.   PROCEDURES PERFORMED:  1. Insertion of venous access port with ultrasound and fluoroscopic guidance. 2. Core biopsy of left groin mass with ultrasound-guided.   SURGEON: Mckinley Jewel, M.D.   ANESTHESIA: Monitored care using local anesthetic containing 0.5% Marcaine and 1% Xylocaine.   COMPLICATIONS: None.   ESTIMATED BLOOD LOSS: Minimal.   DRAINS: None.   DESCRIPTION OF PROCEDURE: The patient was placed in the supine position on the operating table. The right upper chest and neck area were prepped and draped out as a sterile field. An ultrasound probe with a sterile cover was brought up to the field and the subclavian vein was identified beneath the clavicle, in the lateral aspect. Local anesthetic was instilled and with a small skin nick made here a needle was positioned successfully into the subclavian vein with ultrasound guidance with free withdrawal of blood and a guidewire was then passed into the vein. Introducer and dilator were then positioned. The dilator and the guidewire were then removed and the prefilled catheter was then positioned going into the superior vena cava with ultrasound guidance and the outer sheath was removed. The location of the catheter was in the superior vena cava and the skin entrance mark was 20 cm. Local anesthetic was then instilled over the second costal cartilage with the intervening space also infiltrated with local anesthetic. A skin incision was made over the second costal cartilage and a subcutaneous pocket was created with cautery. The catheter was tunneled through to the port site and cut to approximate length and then affixed to a prefilled port. The port was then anchored to the underlying fascia,  in the pocket, with three stitches of 2-0 Prolene. The catheter was smoothed out at the skin entrance site. Fluoroscopy was repeated to ensure that the catheter was still in good position, in the superior vena cava. The catheter was flushed with 10 mL of heparinized saline. The deeper tissues were closed with 3-0 Vicryl and the skin with subcuticular 4-0 Vicryl covered with Dermabond. Following this the left groin area was prepped. The ultrasound probe cover was changed and the mass in question was adequately visualized and noted to be a large hypoechoic mass containing multiple echodense material within it. After instillation of local anesthetic, a small skin nick was made and the Bard core biopsy device was then utilized and multiple cores were obtained from the mass with ultrasound-guided. This was sent separately in formalin for pathologic evaluation. A small skin nick was then covered with a Band-Aid. The patient tolerated the procedure well and was returned to the recovery room in stable condition. Chest x-ray was obtained subsequently showing good positioning of the catheter and no evidence of pneumothorax. ____________________________ S.Robinette Haines, MD sgs:slb D: 12/05/2011 09:59:36 ET T: 12/05/2011 14:01:21 ET JOB#: [id number]  cc: Synthia Innocent. Jamal Collin, MD, <Dictator> [facility] Robinette Haines MD ELECTRONICALLY SIGNED 12/07/2011 18:22
